# Patient Record
Sex: Male | Born: 1945 | Race: White | Hispanic: No | Marital: Married | Smoking: Never smoker
Health system: Southern US, Community
[De-identification: ages and names within clinical notes are randomized; demographics above are authoritative.]

## PROBLEM LIST (undated history)

## (undated) DIAGNOSIS — G4730 Sleep apnea, unspecified: Secondary | ICD-10-CM

## (undated) HISTORY — DX: Sleep apnea, unspecified: G47.30

## (undated) HISTORY — PX: ROTATOR CUFF REPAIR: SHX139

---

## 2011-07-08 ENCOUNTER — Ambulatory Visit (INDEPENDENT_AMBULATORY_CARE_PROVIDER_SITE_OTHER): Payer: PRIVATE HEALTH INSURANCE | Admitting: Internal Medicine

## 2011-07-08 ENCOUNTER — Encounter: Payer: Self-pay | Admitting: Internal Medicine

## 2011-07-08 VITALS — BP 124/82 | HR 97 | Ht 68.0 in | Wt 255.0 lb

## 2011-07-08 DIAGNOSIS — G4733 Obstructive sleep apnea (adult) (pediatric): Secondary | ICD-10-CM

## 2011-07-08 NOTE — Progress Notes (Signed)
Subjective:    Patient ID: Zachary Glass, male    DOB: 05/31/1946, 65 y.o.   MRN: [id number]  HPI 07/08/11- 40 yoM never smoker followed by Dr. Drucie Opitz in [employer] He comes for evaluation of sleep apnea. We are waiting for his diagnostic  NPSG report which was done at [facility] around 1993. He is now on his second CPAP machine and interested in upgrading. He has used an Industrial/product designer which has been associated with epistaxis. He has seen ENT physicians for cautery. He does not know a CPAP pressure, using Advanced. Medical history has included borderline high blood pressure, no heart or lung disease. He had UPPP surgery with turbinate reduction. Bedtime is now between 11 PM and 63M. He falls asleep quickly, waking once or twice before finally up at 7 AM. Weight has gone up 10 pounds. Review of Systems Constitutional:   No-   weight loss, night sweats, fevers, chills, fatigue, lassitude. HEENT:   No-  headaches, difficulty swallowing, tooth/dental problems, sore throat,       No-  sneezing, itching, ear ache, nasal congestion, post nasal drip,  CV:  No-   chest pain, orthopnea, PND, swelling in lower extremities, anasarca, dizziness, palpitations Resp: No-   shortness of breath with exertion or at rest.              No-   productive cough,  No non-productive cough,  No-  coughing up of blood.              No-   change in color of mucus.  No- wheezing.   Skin: No-   rash or lesions. GI:  No-   heartburn, indigestion, abdominal pain, nausea, vomiting, diarrhea,                 change in bowel habits, loss of appetite GU: No-   dysuria, change in color of urine, no urgency or frequency.  No- flank pain. MS:  No-   joint pain or swelling.  No- decreased range of motion.  No- back pain. Neuro- grossly normal to observation, Or:  Psych:  No- change in mood or affect. No depression or anxiety.  No memory loss.      Objective:   Physical Exam General- Alert, Oriented,  Affect-appropriate, Distress- none acute    Skin- rash-none, lesions- none, excoriation- none Lymphadenopathy- none Head- atraumatic            Eyes- Gross vision intact, PERRLA, conjunctivae clear secretions            Ears- Hearing, canals- normal            Nose- Clear, no -Septal dev, mucus, polyps, erosion, perforation;      no evidence of bleeding seen            Throat- +s/p UPPP , mucosa clear , drainage- none, tonsils- atrophic Neck- flexible , trachea midline, no stridor , thyroid nl, carotid no bruit Chest - symmetrical excursion , unlabored           Heart/CV- RRR , no murmur , no gallop  , no rub, nl s1 s2                           - JVD- none , edema- none, stasis changes- none, varices- none           Lung- clear to P&A, wheeze- none, cough- none , dullness-none,  rub- none           Chest wall-  Abd- tender-no, distended-no, bowel sounds-present, HSM- no Br/ Gen/ Rectal- Not done, not indicated Extrem- cyanosis- none, clubbing, none, atrophy- none, strength- nl Neuro- grossly intact to observation         Assessment & Plan:

## 2011-07-08 NOTE — Patient Instructions (Addendum)
We will seek your old Sleep Study documentation which is probably available through the GCDA paper chart  [facility] - please ask [facility] for the settings of his current CPAP machine for our record.  Order- [facility]- PCC- replacement CPAP machine equivalent to his present machine and settings. Mask of choice and supplies.Cool humidifier.  Dx OSA

## 2011-07-12 ENCOUNTER — Encounter: Payer: Self-pay | Admitting: Internal Medicine

## 2011-07-12 DIAGNOSIS — G4733 Obstructive sleep apnea (adult) (pediatric): Secondary | ICD-10-CM | POA: Insufficient documentation

## 2011-07-12 NOTE — Assessment & Plan Note (Signed)
He has a strong history for obstructive sleep apnea. We're going to need to find his original diagnostic study or anticipate updating his study. He has been fully compliant with CPAP and found it quite effective. His nasal mask may have contributed to epistaxis by directing a jet of air into his nostril. He may find a different mask style more satisfactory. We will go forward getting the replacement rig and supplies that he needs

## 2011-07-15 ENCOUNTER — Telehealth: Payer: Self-pay | Admitting: Internal Medicine

## 2011-07-15 DIAGNOSIS — G4733 Obstructive sleep apnea (adult) (pediatric): Secondary | ICD-10-CM

## 2011-07-15 NOTE — Telephone Encounter (Signed)
Called number provided above, [phone], but received message stating the number has been changed, disconnected, or is no longer in service.  Called pt's home number,[phone], [email or web address]

## 2011-07-16 NOTE — Telephone Encounter (Signed)
PT RETURNED CALL. ASKS THAT NURSE CALL HIM BACK "SHORTLY" AS HE HAS TO LEAVE.

## 2011-07-16 NOTE — Telephone Encounter (Signed)
I called and left message on patients cell number that we have placed the order for a new machine VPAP and he should be hearing from his DME company. If any questions or concerns to please call the office and speak with me.   Order placed.

## 2011-07-16 NOTE — Telephone Encounter (Signed)
In response to his message today-Original NPSG from 12/19/92 is now available documenting severe obstructive sleep apnea with RDI/ AHI 72/hr.  Order- [facility]- through his DME- (We had also ordered at recent office visit)  New machine autopap or VPap range 5-15 cwp. Heated humidifier

## 2011-07-16 NOTE — Telephone Encounter (Signed)
Correct cell number is [phone]. Pt says he has had his current cpap machine since approx 2001 and wants to know if a vpap would be an option for him. He complains of recurrent nose bleeds and has heard that vpap is at a variable pressure. He would like to get a new machine before Oct 1 because this is when he goes on Medicare. Pt wants to know if a new sleep study would be needed as well because he would like to do this without having to do a new study. Pls advise.

## 2011-07-21 ENCOUNTER — Encounter: Payer: Self-pay | Admitting: Internal Medicine

## 2011-07-24 ENCOUNTER — Telehealth: Payer: Self-pay | Admitting: Internal Medicine

## 2011-07-24 NOTE — Telephone Encounter (Signed)
[email or web address]  

## 2011-07-25 NOTE — Telephone Encounter (Signed)
Pt returning call can be reached at [phone].Zachary Glass

## 2011-07-25 NOTE — Telephone Encounter (Signed)
Called # provided below - [email or web address]

## 2011-07-25 NOTE — Telephone Encounter (Signed)
Spoke with the pt and he states he is having issues with [facility] and getting his VPAP. He states he has been on CPAP x 20 years and was rec to change to VPAP due to nose bleeds that was believed to be caused by the pressure of the CPAP, so that is why he is changing to VPAP. He states he spoke to his insurance company and they state all they need is an Advertising account executive for VPAP and they will review the request.  He also states they are requesting a letter of medical necessity stating why the pt needs the VPAP.  Pt is requesting to speak to Dr. Maple Hudson about this as well. I have called [facility] to see what the issue is and had to leave a message with the resp team to call us back. The pt is asking that all this be done before 09-01-11 because that is when he go on on medicare. I will await [facility] call and then go from there. Carron Curie, CMA

## 2011-07-28 NOTE — Telephone Encounter (Signed)
lmom that per rhonda new order for vpap has been sent as of 8/22 and [facility] is currently working with his insurance company to get this covered also left on message if he has not heard from [facility] within 2 weeks about this to please give our pcc's a call so they can f/u on it

## 2011-09-24 ENCOUNTER — Encounter: Payer: Self-pay | Admitting: Internal Medicine

## 2011-10-28 ENCOUNTER — Encounter: Payer: Self-pay | Admitting: Internal Medicine

## 2011-12-24 ENCOUNTER — Encounter: Payer: Self-pay | Admitting: Internal Medicine

## 2012-07-07 ENCOUNTER — Ambulatory Visit: Payer: PRIVATE HEALTH INSURANCE | Admitting: Internal Medicine

## 2013-02-01 ENCOUNTER — Ambulatory Visit: Payer: PRIVATE HEALTH INSURANCE | Admitting: Internal Medicine

## 2013-02-02 ENCOUNTER — Telehealth: Payer: Self-pay | Admitting: Internal Medicine

## 2013-02-02 NOTE — Telephone Encounter (Signed)
Per Zachary Glass okay to work pt in 02/15/13 at 11:00. I have scheduled him at that time. He needed nothing further

## 2013-02-15 ENCOUNTER — Ambulatory Visit (INDEPENDENT_AMBULATORY_CARE_PROVIDER_SITE_OTHER): Payer: Medicare Other | Admitting: Internal Medicine

## 2013-02-15 ENCOUNTER — Encounter: Payer: Self-pay | Admitting: Internal Medicine

## 2013-02-15 VITALS — BP 128/80 | HR 92 | Ht 67.75 in | Wt 249.6 lb

## 2013-02-15 DIAGNOSIS — G4733 Obstructive sleep apnea (adult) (pediatric): Secondary | ICD-10-CM

## 2013-02-15 NOTE — Progress Notes (Signed)
Subjective:    Patient ID: Zachary Glass, male    DOB: Dec 08, 1945, 67 y.o.   MRN: [id number]  HPI 07/08/11- 28 yoM never smoker followed by Dr. Drucie Opitz in [facility]. He comes for evaluation of sleep apnea. We are waiting for his diagnostic  NPSG report which was done at [facility] around 1993. He is now on his second CPAP machine and interested in upgrading. He has used an Industrial/product designer which has been associated with epistaxis. He has seen ENT physicians for cautery. He does not know a CPAP pressure, using Advanced. Medical history has included borderline high blood pressure, no heart or lung disease. He had UPPP surgery with turbinate reduction. Bedtime is now between 11 PM and 27M. He falls asleep quickly, waking once or twice before finally up at 7 AM. Weight has gone up 10 pounds.  02/15/13-  11 yoM never smoker followed by Dr. Drucie Opitz in [facility]. Followed here for  sleep apnea FOLLOWS FOR: still wears PAP every night; has had several ups and downs with machine and service. Now using VPAP/ [facility] I 15- E 5, PS 4. Swift nasal mask w/ chin strap. Sleeping well  Review of Systems Constitutional:   No-   weight loss, night sweats, fevers, chills, fatigue, lassitude. HEENT:   No-  headaches, difficulty swallowing, tooth/dental problems, sore throat,       No-  sneezing, itching, ear ache, nasal congestion, post nasal drip,  CV:  No-   chest pain, orthopnea, PND, swelling in lower extremities, anasarca, dizziness, palpitations Resp: No-   shortness of breath with exertion or at rest.              No-   productive cough,  No non-productive cough,  No-  coughing up of blood.              No-   change in color of mucus.  No- wheezing.   Skin: No-   rash or lesions. GI:  No-   heartburn, indigestion, abdominal pain, nausea, vomiting, GU: . MS:  No-   joint pain or swelling.   Neuro- nothing unusual  Psych:  No- change in mood or affect. No depression or anxiety.  No  memory loss.  Objective:   Physical Exam General- Alert, Oriented, Affect-appropriate, Distress- none acute    Skin- rash-none, lesions- none, excoriation- none Lymphadenopathy- none Head- atraumatic            Eyes- Gross vision intact, PERRLA, conjunctivae clear secretions            Ears- Hearing, canals- normal            Nose- Clear, no -Septal dev, mucus, polyps, erosion, perforation;                  Throat- +s/p UPPP , mucosa clear , drainage- none, tonsils- atrophic Neck- flexible , trachea midline, no stridor , thyroid nl, carotid no bruit Chest - symmetrical excursion , unlabored           Heart/CV- RRR , no murmur , no gallop  , no rub, nl s1 s2                           - JVD- none , edema- none, stasis changes- none, varices- none           Lung- clear to P&A, wheeze- none, cough- none , dullness-none, rub- none  Chest wall-  Abd- Br/ Gen/ Rectal- Not done, not indicated Extrem- cyanosis- none, clubbing, none, atrophy- none, strength- nl Neuro- grossly intact to observation   Assessment & Plan:

## 2013-02-15 NOTE — Patient Instructions (Addendum)
We can continue VPAP 15-5, PS 4/ [facility]    Please call as needed

## 2013-02-19 NOTE — Assessment & Plan Note (Signed)
Good compliance and control. Weight loss would help as discussed.

## 2014-02-15 ENCOUNTER — Ambulatory Visit (INDEPENDENT_AMBULATORY_CARE_PROVIDER_SITE_OTHER): Payer: Medicare Other | Admitting: Internal Medicine

## 2014-02-15 ENCOUNTER — Encounter (INDEPENDENT_AMBULATORY_CARE_PROVIDER_SITE_OTHER): Payer: Self-pay

## 2014-02-15 ENCOUNTER — Encounter: Payer: Self-pay | Admitting: Internal Medicine

## 2014-02-15 VITALS — BP 144/82 | HR 82 | Ht 67.75 in | Wt 236.0 lb

## 2014-02-15 DIAGNOSIS — G4733 Obstructive sleep apnea (adult) (pediatric): Secondary | ICD-10-CM

## 2014-02-15 NOTE — Progress Notes (Signed)
Subjective:    Patient ID: Zachary Glass, male    DOB: June 03, 1946, 68 y.o.   MRN: [id number]  HPI 07/08/11- 42 yoM never smoker followed by Dr. Drucie Opitz in [facility] He comes for evaluation of sleep apnea. We are waiting for his diagnostic  NPSG report which was done at [facility] around 1993. He is now on his second CPAP machine and interested in upgrading. He has used an Industrial/product designer which has been associated with epistaxis. He has seen ENT physicians for cautery. He does not know a CPAP pressure, using [facility]. Medical history has included borderline high blood pressure, no heart or lung disease. He had UPPP surgery with turbinate reduction. Bedtime is now between 11 PM and 72M. He falls asleep quickly, waking once or twice before finally up at 7 AM. Weight has gone up 10 pounds.  02/15/13-  70 yoM never smoker followed by Dr. Drucie Opitz in [facility]. Followed here for  sleep apnea FOLLOWS FOR: still wears PAP every night; has had several ups and downs with machine and service. Now using VPAP/ [facility] I 15- E 5, PS 4. Swift nasal mask w/ chin strap. Sleeping well  02/15/14- 67 yoM never smoker followed by Dr. Drucie Opitz in [facility]. Followed here for  sleep apnea Follows for: wears VPAP/ [facility] I 15- E 5, PS 4. Swift nasal mask w/ chin strap nightly, 6-8 hours.  Has an appt 3/25 to change headgear.  Had sinus sx with Dr. Christell Constant in [facility]  10/2013-resolved. Strap itches back of scalp- trying for a different fit.   Review of Systems Constitutional:   No-   weight loss, night sweats, fevers, chills, fatigue, lassitude. HEENT:   No-  headaches, difficulty swallowing, tooth/dental problems, sore throat,       No-  sneezing, itching, ear ache, nasal congestion, post nasal drip,  CV:  No-   chest pain, orthopnea, PND, swelling in lower extremities, anasarca, dizziness, palpitations Resp: No-   shortness of breath with exertion or at rest.              No-    productive cough,  No non-productive cough,  No-  coughing up of blood.              No-   change in color of mucus.  No- wheezing.   Skin: +itching occipital area ? PAP strap? GI:  No-   heartburn, indigestion, abdominal pain, nausea, vomiting, GU: . MS:  No-   joint pain or swelling.   Neuro- nothing unusual  Psych:  No- change in mood or affect. No depression or anxiety.  No memory loss.  Objective:   Physical Exam General- Alert, Oriented, Affect-appropriate, Distress- none acute , overweight Skin- rash-none, lesions- none, excoriation- none Lymphadenopathy- none Head- atraumatic            Eyes- Gross vision intact, PERRLA, conjunctivae clear secretions            Ears- Hearing, canals- normal            Nose- Clear, no -Septal dev, mucus+, polyps, erosion, perforation;                  Throat- +s/p UPPP , mucosa clear , drainage- none, tonsils- atrophic Neck- flexible , trachea midline, no stridor , thyroid nl, carotid no bruit Chest - symmetrical excursion , unlabored           Heart/CV- RRR , no  murmur , no gallop  , no rub, nl s1 s2                           - JVD- none , edema- none, stasis changes- none, varices- none           Lung- clear to P&A, wheeze- none, cough- none , dullness-none, rub- none           Chest wall-  Abd- Br/ Gen/ Rectal- Not done, not indicated Extrem- cyanosis- none, clubbing, none, atrophy- none, strength- nl Neuro- grossly intact to observation   Assessment & Plan:

## 2014-02-15 NOTE — Assessment & Plan Note (Signed)
Discussed mask fit options. He will address w/ DME [facility]. Ok to continue VPAP 15/5 PS 4- good compliance and control

## 2014-02-15 NOTE — Patient Instructions (Signed)
We can continue VPAP 15/ 5 PS14    [facility]  Please call as needed

## 2015-02-13 ENCOUNTER — Encounter: Payer: Self-pay | Admitting: Internal Medicine

## 2015-02-13 ENCOUNTER — Encounter (INDEPENDENT_AMBULATORY_CARE_PROVIDER_SITE_OTHER): Payer: Self-pay

## 2015-02-13 ENCOUNTER — Ambulatory Visit (INDEPENDENT_AMBULATORY_CARE_PROVIDER_SITE_OTHER): Payer: Medicare Other | Admitting: Internal Medicine

## 2015-02-13 VITALS — BP 136/78 | HR 97 | Ht 67.75 in | Wt 228.4 lb

## 2015-02-13 DIAGNOSIS — G4733 Obstructive sleep apnea (adult) (pediatric): Secondary | ICD-10-CM | POA: Diagnosis not present

## 2015-02-13 NOTE — Progress Notes (Signed)
Subjective:    Patient ID: Zachary Glass, male    DOB: 08-12-1946, 69 y.o.   MRN: [id number]  HPI 07/08/11- 13 yoM never smoker followed by Dr. Drucie Opitz in [facility]. He comes for evaluation of sleep apnea. We are waiting for his diagnostic  NPSG report which was done at [facility] around 1993. He is now on his second CPAP machine and interested in upgrading. He has used an Industrial/product designer which has been associated with epistaxis. He has seen ENT physicians for cautery. He does not know a CPAP pressure, using Advanced. Medical history has included borderline high blood pressure, no heart or lung disease. He had UPPP surgery with turbinate reduction. Bedtime is now between 11 PM and 25M. He falls asleep quickly, waking once or twice before finally up at 7 AM. Weight has gone up 10 pounds.  02/15/13-  60 yoM never smoker followed by Dr. Drucie Opitz in [facility]. Followed here for  sleep apnea FOLLOWS FOR: still wears PAP every night; has had several ups and downs with machine and service. Now using VPAP/ [facility] I 15- E 5, PS 4. Swift nasal mask w/ chin strap. Sleeping well  02/15/14- 67 yoM never smoker followed by Dr. Drucie Opitz in [facility]. Followed here for  sleep apnea Follows for: wears VPAP/ [facility] I 15- E 5, PS 4. Swift nasal mask w/ chin strap nightly, 6-8 hours.  Has an appt 3/25 to change headgear.  Had sinus sx with Dr. Christell Constant in [facility]  10/2013-resolved. Strap itches back of scalp- trying for a different fit.   02/13/15- 67 yoM never smoker followed for OSA,  PCP Dr. Drucie Opitz in [facility].  FOLLOWS FOR: Wears ViPAP 15 Insp/ 5 Exp, PS 4, [facility] every night for about 6-7 hours;  He feels he benefits and is doing well. We discussed comfort measures.  Review of Systems Constitutional:   No-   weight loss, night sweats, fevers, chills, fatigue, lassitude. HEENT:   No-  headaches, difficulty swallowing, tooth/dental problems, sore throat,       No-   sneezing, itching, ear ache, nasal congestion, post nasal drip,  CV:  No-   chest pain, orthopnea, PND, swelling in lower extremities, anasarca, dizziness, palpitations Resp: No-   shortness of breath with exertion or at rest.              No-   productive cough,  No non-productive cough,  No-  coughing up of blood.              No-   change in color of mucus.  No- wheezing.   Skin:  GI:  No-   heartburn, indigestion, abdominal pain, nausea, vomiting, GU: . MS:  No-   joint pain or swelling.   Neuro- nothing unusual  Psych:  No- change in mood or affect. No depression or anxiety.  No memory loss.  Objective:   Physical Exam General- Alert, Oriented, Affect-appropriate, Distress- none acute , overweight Skin- rash-none, lesions- none, excoriation- none Lymphadenopathy- none Head- atraumatic            Eyes- Gross vision intact, PERRLA, conjunctivae clear secretions            Ears- Hearing, canals- normal            Nose- Clear, no -Septal dev, mucus+, polyps, erosion, perforation;                  Throat- +  s/p UPPP , +mucosa a little dry , drainage- none, tonsils- atrophic Neck- flexible , trachea midline, no stridor , thyroid nl, carotid no bruit Chest - symmetrical excursion , unlabored           Heart/CV- RRR , no murmur , no gallop  , no rub, nl s1 s2                           - JVD- none , edema- none, stasis changes- none, varices- none           Lung- clear to P&A, wheeze- none, cough- none , dullness-none, rub- none           Chest wall-  Abd- Br/ Gen/ Rectal- Not done, not indicated Extrem- cyanosis- none, clubbing, none, atrophy- none, strength- nl Neuro- grossly intact to observation   Assessment & Plan:

## 2015-02-13 NOTE — Patient Instructions (Signed)
We can continue BIPAP 15/5 [facility]  Consider trying otc mouth rinse Biotene at bedtime a few nights, to deal with dry mouth  Order- DME [facility]- download for pressure compliance, and Airview if available   Dx OSA

## 2015-02-25 NOTE — Assessment & Plan Note (Signed)
He seems to be doing well with good apparent compliance and control. Medically necessary. Plan-download for documentation

## 2016-02-12 ENCOUNTER — Encounter: Payer: Self-pay | Admitting: Internal Medicine

## 2016-02-12 ENCOUNTER — Ambulatory Visit (INDEPENDENT_AMBULATORY_CARE_PROVIDER_SITE_OTHER): Payer: Medicare Other | Admitting: Internal Medicine

## 2016-02-12 VITALS — BP 144/90 | HR 98 | Ht 67.75 in | Wt 228.6 lb

## 2016-02-12 DIAGNOSIS — G4733 Obstructive sleep apnea (adult) (pediatric): Secondary | ICD-10-CM | POA: Diagnosis not present

## 2016-02-12 DIAGNOSIS — R21 Rash and other nonspecific skin eruption: Secondary | ICD-10-CM | POA: Diagnosis not present

## 2016-02-12 NOTE — Assessment & Plan Note (Signed)
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 I recommended follow-up with dermatology, or ask his PCP to [facility].

## 2016-02-12 NOTE — Assessment & Plan Note (Signed)
He may be appropriate for oral appliance consideration as an alternative to CPAP. Plan-update sleep study

## 2016-02-12 NOTE — Progress Notes (Signed)
Subjective:    Patient ID: Zachary Glass, male    DOB: 1946-08-21, 70 y.o.   MRN: [id number]  HPI 07/08/11- 73 yoM never smoker followed by Dr. Drucie Opitz in [facility] He comes for evaluation of sleep apnea. We are waiting for his diagnostic  NPSG report which was done at [facility] around 1993. He is now on his second CPAP machine and interested in upgrading. He has used an Industrial/product designer which has been associated with epistaxis. He has seen ENT physicians for cautery. He does not know a CPAP pressure, using [facility]. Medical history has included borderline high blood pressure, no heart or lung disease. He had UPPP surgery with turbinate reduction. Bedtime is now between 11 PM and 66M. He falls asleep quickly, waking once or twice before finally up at 7 AM. Weight has gone up 10 pounds.  02/15/13-  70 yoM never smoker followed by Dr. Drucie Opitz in [facility]. Followed here for  sleep apnea FOLLOWS FOR: still wears PAP every night; has had several ups and downs with machine and service. Now using VPAP/ [facility] I 15- E 5, PS 4. Swift nasal mask w/ chin strap. Sleeping well  02/15/14- 67 yoM never smoker followed by Dr. Drucie Opitz in [facility]. Followed here for  sleep apnea Follows for: wears VPAP/ [facility] I 15- E 5, PS 4. Swift nasal mask w/ chin strap nightly, 6-8 hours.  Has an appt 3/25 to change headgear.  Had sinus sx with Dr. Christell Constant in [facility]  10/2013-resolved. Strap itches back of scalp- trying for a different fit.   02/13/15- 67 yoM never smoker followed for OSA/ UPPP,  PCP Dr. Drucie Opitz in [facility].  FOLLOWS FOR: Wears ViPAP 15 Insp/ 5 Exp, PS 4, [facility] every night for about 6-7 hours;  He feels he benefits and is doing well. We discussed comfort measures.  02/12/2016-70 year old male never smoker followed for OSA VPAP I 15- E 5, PS 4/ [facility] FOLLOWS FOR: Pt wears VPAP nightly-DME: [facility]. No recent DL. Pt states he is having itchy spells since  [facility] like suggestions of what caused it and what to do to help. His original sleep study was done in 1993. He continues to be irritated by mask and straps which he finds annoying. We discussed alternatives. He will need updated study to get insurance coverage for additional intervention. Additional problem-itching. Onset after her arms got irritated using a leaf blower during [place]. Has had raised crusting lesions on his hands and arms. Biopsy by dermatology "staph". He's been treated with topical medication from dermatology. He is instructed to a thin Clorox water. Still having occasional new lesions which he'll with scarring.  Review of Systems Constitutional:   No-   weight loss, night sweats, fevers, chills, fatigue, lassitude. HEENT:   No-  headaches, difficulty swallowing, tooth/dental problems, sore throat,       No-  sneezing, itching, ear ache, nasal congestion, post nasal drip,  CV:  No-   chest pain, orthopnea, PND, swelling in lower extremities, anasarca, dizziness, palpitations Resp: No-   shortness of breath with exertion or at rest.              No-   productive cough,  No non-productive cough,  No-  coughing up of blood.              No-   change in color of mucus.  No- wheezing.   Skin:  GI:  No-   heartburn, indigestion, abdominal pain, nausea, vomiting, GU: . MS:  No-   joint pain or swelling.   Neuro- nothing unusual  Psych:  No- change in mood or affect. No depression or anxiety.  No memory loss.  Objective:   Physical Exam General- Alert, Oriented, Affect-appropriate, Distress- none acute , overweight Skin- + small crusted lesion on the thumb web. Area of scarring on left hand. Dry skin. Lymphadenopathy- none Head- atraumatic            Eyes- Gross vision intact, PERRLA, conjunctivae clear secretions            Ears- Hearing, canals- normal            Nose- Clear, no -Septal dev, mucus+, polyps, erosion, perforation;                   Throat- +s/p UPPP , +mucosa a little dry , drainage- none, tonsils- atrophic Neck- flexible , trachea midline, no stridor , thyroid nl, carotid no bruit Chest - symmetrical excursion , unlabored           Heart/CV- RRR , no murmur , no gallop  , no rub, nl s1 s2                           - JVD- none , edema- none, stasis changes- none, varices- none           Lung- clear to P&A, wheeze- none, cough- none , dullness-none, rub- none           Chest wall-  Abd- Br/ Gen/ Rectal- Not done, not indicated Extrem- cyanosis- none, clubbing, none, atrophy- none, strength- nl Neuro- grossly intact to observation   Assessment & Plan:

## 2016-02-12 NOTE — Patient Instructions (Signed)
Order- schedule unattended home sleep test   Dx OSA  Suggest you get another dermatology opinion about the itching rash  You could ask [facility] when, from their records, you might be eligible for a new CPAP machine, and whether they have information on the Transcend portable machines  We have talked about looking into an oral appliance instead of CPAP

## 2016-03-11 DIAGNOSIS — G4733 Obstructive sleep apnea (adult) (pediatric): Secondary | ICD-10-CM | POA: Diagnosis not present

## 2016-03-27 ENCOUNTER — Ambulatory Visit: Payer: Medicare Other | Admitting: Internal Medicine

## 2016-03-31 ENCOUNTER — Encounter: Payer: Self-pay | Admitting: Internal Medicine

## 2016-03-31 ENCOUNTER — Ambulatory Visit (INDEPENDENT_AMBULATORY_CARE_PROVIDER_SITE_OTHER): Payer: Medicare Other | Admitting: Internal Medicine

## 2016-03-31 VITALS — BP 122/70 | HR 81 | Ht 67.75 in | Wt 229.4 lb

## 2016-03-31 DIAGNOSIS — G4733 Obstructive sleep apnea (adult) (pediatric): Secondary | ICD-10-CM

## 2016-03-31 NOTE — Patient Instructions (Signed)
Order- DME [facility]    Small/ travel PAP machine- if available want VPAP insp 15, exp 5, PS 4  With supplies, mask of choice, humidifier   Dx OSA. Please discuss alternatives with patient  Can also look on-line at sites like CPAP.com to look at alternatives available  Suggest you talk with your primary physician about the easy bruising on your fore-arms. It may just be related to age and sun-damage, but ask him if liver function and blood clotting tests have been done recently.

## 2016-03-31 NOTE — Progress Notes (Signed)
Subjective:    Patient ID: Zachary Glass, male    DOB: Aug 28, 1946, 70 y.o.   MRN: [id number]  HPI 07/08/11- [age] yoM never smoker followed [place] Drucie OpitzGordon [place] [facility] [place]. He comes for evaluation of sleep apnea. We are waiting for his diagnostic  NPSG report which was done [facility] around [facility]. He is now on his second CPAP machine and interested in upgrading. 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 He has seen ENT physicians for cautery. He does not know a CPAP pressure, using [facility]. Medical history has included borderline high blood pressure, no heart or lung disease. He had UPPP surgery with turbinate reduction. Bedtime is now between 11 PM and 30M. He falls asleep quickly, waking once or twice before finally up at 7 AM. Weight has gone up 10 pounds.  02/15/13-  [age] yoM never smoker followed [place] Drucie [place]itzGordon [facility] [place] [facility] [place]. Followed here for  sleep apnea FOLLOWS FOR: still wears PAP every night; has had several ups and downs with machine and service. Now using VPAP/ [facility] I 15- E 5, PS 4. Swift nasal mask w/ chin strap. Sleeping well  02/15/14- 67 yoM never smoker followed [place] Drucie [place]itzGordon [place] [facility] [place]. Followed here for  sleep apnea Follows for: wears VPAP/ [facility] I 15- E 5, PS 4. Swift nasal mask w/ chin strap nightly, 6-8 hours.  Has an appt 3/25 to change headgear.  [place] [facility] [place]  10/[place]-resolved. Strap itches back of scalp- trying for a different fit.   02/13/15- 67 yoM never smoker followed for OSA/ UPPP,  [place] Dru[place] Op[place]Gordon [place] [facility] [place].  FOLLOWS FOR: Wears ViPAP 15 Insp/ 5 Exp, PS 4, [facility] every night for about 6-7 hours;  He feels he benefits and is doing well. We discussed comfort measures.  02/12/2016-70 year old male never smoker followed for OSA VPAP I 15- E 5, PS 4/ [facility] FOLLOWS FOR: Pt wears VPAP nightly-DME: [facility]. No recent DL. Pt states he is having [facility] spells [facility]-would like suggestions of what caused it and what to do to help. His original sleep study was done in 1993. He continues to be irritated by mask and straps which he finds annoying. We discussed alternatives. He will need updated study to get insurance coverage for additional intervention. Additional problem-itching. Onset  arms got irritated using a leaf blower [place]. Has had raised crusting lesions on his hands and arms. Biopsy by dermatology "staph". He's been treated with topical medication from dermatology. He is instructed to bathe in Clorox water. Still having occasional new lesions which heal with scarring.  03/31/2016-70 year old male never smoker followed for OSA, complicated by staph dermatitis VPAP I 15- E 5, PS 4/ [facility] FOLLOWS FOR: Review HST with patient; might be due for new BiPAP in 08-2016 and/or travel unit.     Review of Systems Constitutional:   No-   weight loss, night sweats, fevers, chills, fatigue, lassitude. HEENT:   No-  headaches, difficulty swallowing, tooth/dental problems, sore throat,       No-  sneezing, itching, ear ache, nasal congestion, post nasal drip,  CV:  No-   chest pain, orthopnea, PND, swelling in lower extremities, anasarca, dizziness, palpitations Resp: No-   shortness of breath with exertion or at rest.              No-   productive  cough,  No non-productive cough,  No-  coughing up of blood.              No-   change in color of mucus.  No- wheezing.   Skin:  GI:  No-   heartburn, indigestion, abdominal pain, nausea, vomiting, GU: . MS:  No-   joint pain or swelling.   Neuro- nothing unusual  Psych:  No- change in mood or affect. No depression or anxiety.  No memory loss.  Objective:   Physical Exam General- Alert, Oriented, Affect-appropriate, Distress- none acute , overweight Skin- + small crusted lesion on the thumb web. Area of scarring on left hand. Dry skin. Lymphadenopathy- none Head-  atraumatic            Eyes- Gross vision intact, PERRLA, conjunctivae clear secretions            Ears- Hearing, canals- normal            Nose- Clear, no -Septal dev, mucus+, polyps, erosion, perforation;                  Throat- +s/p UPPP , +mucosa a little dry , drainage- none, tonsils- atrophic Neck- flexible , trachea midline, no stridor , thyroid nl, carotid no bruit Chest - symmetrical excursion , unlabored           Heart/CV- RRR , no murmur , no gallop  , no rub, nl s1 s2                           - JVD- none , edema- none, stasis changes- none, varices- none           Lung- clear to P&A, wheeze- none, cough- none , dullness-none, rub- none           Chest wall-  Abd- Br/ Gen/ Rectal- Not done, not indicated Extrem- cyanosis- none, clubbing, none, atrophy- none, strength- nl Neuro- grossly intact to observation   Assessment & Plan:

## 2016-04-02 DIAGNOSIS — G4733 Obstructive sleep apnea (adult) (pediatric): Secondary | ICD-10-CM | POA: Diagnosis not present

## 2016-04-03 ENCOUNTER — Other Ambulatory Visit: Payer: Self-pay | Admitting: *Deleted

## 2016-04-03 DIAGNOSIS — G4733 Obstructive sleep apnea (adult) (pediatric): Secondary | ICD-10-CM

## 2016-08-12 ENCOUNTER — Telehealth: Payer: Self-pay | Admitting: Internal Medicine

## 2016-08-12 DIAGNOSIS — G4733 Obstructive sleep apnea (adult) (pediatric): Secondary | ICD-10-CM

## 2016-08-12 NOTE — Telephone Encounter (Signed)
If patient wants to replace old machine- ok to order replacement for old CPAP, current settings, mask of choice, supplies, humidifier, AirView   Dx OSA

## 2016-08-12 NOTE — Telephone Encounter (Signed)
Dr Maple HudsonYoung, [facility] is now eligible for new CPAP- do you want us to send in a an order? Please advise, thanks!

## 2016-08-12 NOTE — Telephone Encounter (Signed)
Attempted to contact patient, left message for patient to return call.

## 2016-08-13 NOTE — Telephone Encounter (Signed)
Attempted to contact patient, left message for patient to return call.

## 2016-08-14 NOTE — Telephone Encounter (Signed)
Order was placed for new machine & I called pt to discuss which new dme he would like to use.  Pt states he doesn't know if he wants to switch companies or not because he wants to check with the different ones to see what they offer.  He wants Dr Maple HudsonYoung to let him know which brands he prefers - he asked that he be given his top 3 recommendations & then he will call the dme companies to see what they have.  He states he may not research this until a month from now but would like to go ahead & get Dr Roxy CedarYoung's recommendations.  I told him I would close out the order that was created today & when he decides which company he wants to use to call us back & let the nurse know so a new order can be put in.

## 2016-08-14 NOTE — Telephone Encounter (Signed)
Well most of the dme's take him medicare and his sup- just give an order to change his dme and put his cpap settings and supplies in the order [employer]ting[employer]

## 2016-08-14 NOTE — Telephone Encounter (Signed)
Order placed. Thanks.

## 2016-08-14 NOTE — Telephone Encounter (Signed)
Spoke with pt. States that he would like a new machine but is thinking about going with a different DME. [facility] that [facility] when it comes to his supplies. Pt would like to know what other DME's are in network with his insurance.  [facility] can you help with this? Thank you.

## 2016-11-25 ENCOUNTER — Telehealth: Payer: Self-pay | Admitting: Internal Medicine

## 2016-11-25 NOTE — Telephone Encounter (Signed)
Pt returning call.Zachary Glass ° °

## 2016-11-25 NOTE — Telephone Encounter (Signed)
lmtcb X1 for Melissa at [facility]. 

## 2016-11-25 NOTE — Telephone Encounter (Signed)
[facility]states the patient can not get his BiPAP/VPAP as they do not have a CPAP titration failed testing on file and Insurance requires this for payment. I explained this to the patient and he has further questions as to why they need it now since he has been on Medicare since 2006. Pt is aware that we will speak with our rep [facility] and get back with him.  Triage please contact [facility] rep-Melissa or person covering her to speak with patient regarding this matter.

## 2016-11-25 NOTE — Telephone Encounter (Signed)
Called and lmom to make the pt aware that we are waiting to hear back from [facility] and that once we hear from her, we will call him with an update.

## 2016-11-26 NOTE — Telephone Encounter (Signed)
lmtcb for Zachary Glass with [facility]

## 2016-11-26 NOTE — Telephone Encounter (Signed)
Melissa returned phone call: [phone].Charm Rings.Erica R Taylor

## 2016-11-26 NOTE — Telephone Encounter (Signed)
[facility] with [facility] with [facility], who states pt was placed on a cpap years back and started having a nose bleeds, so he wanted to be placed on a bipap. Melissa states basically pt was placed on bipap without the proper testing, therefore [facility] was not paid by pt's insurance. Now that pt is requesting a new bipap machine. Pt will need to go for a sleep study and fail a cpap, and be titrated to a bipap. Melissa states pt is aware of this.   CY please advise. Thanks.

## 2016-12-05 NOTE — Telephone Encounter (Signed)
Ok to order split protocol NPSG for dx OSA. Please specify in order to titrate BIPAP if CPAP not tolerated.

## 2016-12-05 NOTE — Telephone Encounter (Signed)
Spoke with pt and made him aware of CY recommendations. Pt states he would like to discuss this issue with CY before doing any further testing. Pt is very upset and states he will not do this sleep study, until everyone's files match. CY doesn't have any open 30min slots until 02-09-17 CY please advise where pt can be worked in. Thanks.

## 2016-12-05 NOTE — Telephone Encounter (Signed)
lmtcb x1 for pt. 

## 2017-02-03 NOTE — Telephone Encounter (Signed)
Spoke with patient-he will be here Monday 02/09/17 at 11:30am. Nothing more needed at this time.

## 2017-02-09 ENCOUNTER — Encounter: Payer: Self-pay | Admitting: Internal Medicine

## 2017-02-09 ENCOUNTER — Ambulatory Visit (INDEPENDENT_AMBULATORY_CARE_PROVIDER_SITE_OTHER): Payer: Medicare Other | Admitting: Internal Medicine

## 2017-02-09 ENCOUNTER — Telehealth: Payer: Self-pay | Admitting: *Deleted

## 2017-02-09 VITALS — BP 148/86 | HR 101 | Ht 67.75 in | Wt 232.0 lb

## 2017-02-09 DIAGNOSIS — G4733 Obstructive sleep apnea (adult) (pediatric): Secondary | ICD-10-CM

## 2017-02-09 NOTE — Patient Instructions (Signed)
Order- DME [facility]- intolerant of CPAP. Needs replacement for old VPAP machine, Insp 15, Exp 5, PS 4, Mask of choice, supplies, AirView   Dx OSA  Please call as needed

## 2017-02-09 NOTE — Telephone Encounter (Signed)
-----   Message from Henderson NewcomerMelissa Stenson sent at 02/09/2017 12:22 PM EDT ----- Regarding: RE: BiPAP concers Hey.  He has been with us for a while.   I looked at his records and it looks like he did receive a bipap back in 2012 but we never received documentation at that time that he failed cpap and was moved to bipap.  He is now wanting a replacement bipap but we still don't have any documentation showing that he had a titration failing CPAP.  His insurance will not cover a new bipap with out that documentation.     Does this help? Melissa  ----- Message ----- From: Ronny BaconKatie C Welchel, CMA Sent: 02/09/2017  11:43 AM To: Melissa Stenson Subject: BiPAP concers                                  Melissa,  Can you please help me out-this patient was seen today and [facility] me [facility] being a patient with you for BiPAP services.   He states he has been with you guys for years! HELP PLEASE!! :)   Vivianne SpenceKatie Welchel,CMA

## 2017-02-09 NOTE — Progress Notes (Signed)
Subjective:    Patient ID: Zachary Glass, male    DOB: 1946/02/16, 71 y.o.   MRN: [id number]  HPI male never smoker followed for OSA/ UPPP, complicated by history of staph dermatitis NPSG 12/19/92- AHI 72/ hr, desaturation to 92%, body weight 250 lbs  ------------------------------------------------------------------------------------------------ 03/31/2016-71 year old male never smoker followed for OSA, complicated by staph dermatitis VPAP I 15- E 5, PS 4/ [facility] FOLLOWS FOR: Review HST with patient; might be due for new BiPAP in 08-2016 and/or travel unit. Unattended Home Sleep Test 03/11/2016-AHI 9.9/hour, desaturation to 86%, body weight 228 pounds  02/09/2017-71 year old male never smoker followed for OSA/ UPPP, complicated by history of staph dermatitis VPAP I 15, E 5, PS 4/ [facility] DME told him he would have to have a CPAP titration study demonstrating that BiPAP was required for a new BiPAP machine could be provided. FOLLOWS FOR: DME:[facility]. Pt [facility] not a [facility] theirs ([facility]). Pt then states that BiPAP not being covered by insurance due to no records from [facility]. Pt has CPAP titration in the past -prior to Medicare.  CPAP titration in 1994 had indicated 12 CWP He was changed from CPAP to VPAP in 2012 apparently on our order to his DME company. At that time a qualifying study apparently was not done-we have no record. Now he needs replacement machine and Medicare rules are requiring he have a study documenting that CPAP is insufficient. I have tried sending a renewal order to [facility], but they're saying this does not provide needed documentation. He has been very compliant with his VPAP and definitely feels that he sleeps better with it.  Review of Systems Constitutional:   No-   weight loss, night sweats, fevers, chills, fatigue, lassitude. HEENT:   No-  headaches, difficulty swallowing, tooth/dental problems, sore throat,     No-  sneezing, itching, ear ache, nasal congestion, post nasal drip,  CV:  No-   chest pain, orthopnea, PND, swelling in lower extremities, anasarca, dizziness, palpitations Resp: No-   shortness of breath with exertion or at rest.              No-   productive cough,  No non-productive cough,  No-  coughing up of blood.              No-   change in color of mucus.  No- wheezing.   Skin:  GI:  No-   heartburn, indigestion, abdominal pain, nausea, vomiting, GU: . MS:  No-   joint pain or swelling.   Neuro- nothing unusual  Psych:  No- change in mood or affect. No depression or anxiety.  No memory loss.  Objective:   Physical Exam General- Alert, Oriented, Affect-appropriate, Distress- none acute , + overweight Skin- no rash Lymphadenopathy- none Head- atraumatic            Eyes- Gross vision intact, PERRLA, conjunctivae clear secretions            Ears- Hearing, canals- normal            Nose- Clear, no -Septal dev, mucus+, polyps, erosion, perforation;                  Throat- +s/p UPPP , mucosa -clear, drainage- none, tonsils- atrophic Neck- flexible , trachea midline, no stridor , thyroid nl, carotid no bruit Chest - symmetrical excursion , unlabored           Heart/CV-  RRR , no murmur , no gallop  , no rub, nl s1 s2                           - JVD- none , edema- none, stasis changes- none, varices- none           Lung- clear to P&A, wheeze- none, cough- none , dullness-none, rub- none           Chest wall-  Abd- Br/ Gen/ Rectal- Not done, not indicated Extrem- cyanosis- none, clubbing, none, atrophy- none, strength- nl Neuro- grossly intact to observation   Assessment & Plan:

## 2017-02-09 NOTE — Assessment & Plan Note (Signed)
He had done very well with VPAP and remembers this as much more satisfactory than CPAP. His DME company is very clear now that he is going to require a documentation study showing that CPAP provides insufficient control before Medicare will cover VPAP machine. I will discuss alternatives, including a trial of CPAP with auto Pap, a CPAP/BiPAP titration study, an oral appliance.

## 2017-02-11 NOTE — Telephone Encounter (Signed)
Pt is aware of CPAP titration needed to show he can not tolerate CPAP and needs BiPAP. Pt is willing to go forward with CPAP study. Order placed and PCC's to contact patient to set up date and time. Nothing more needed at this time.

## 2017-02-11 NOTE — Telephone Encounter (Signed)
Please tell Mr Zachary Glass: We have verified that the problem is not how the order is written. His insurance will not cover his device on our notes alone, no matter what we say. His DME did accept the order that way years ago, but the rules have tightened. To get this through his insurance, we need to order a CPAP titration sleep study. When he is there, he can tell the technician if CPAP is not comfortable for him so the tech can convert to BIPAP during the study. That would be the best way.

## 2017-02-12 ENCOUNTER — Telehealth: Payer: Self-pay | Admitting: Internal Medicine

## 2017-02-12 NOTE — Telephone Encounter (Signed)
Pt is scheduled 03/16/17@8pm  for cpap titration study Zachary Glass

## 2017-02-13 NOTE — Telephone Encounter (Signed)
Spoke to pt he can not go  To sleep center 03/16/17 appt was moved to 03/31/17@8Pm  mailed sleep packet to him with appt info in it [employer]ally E Ottinger

## 2017-03-16 ENCOUNTER — Encounter (HOSPITAL_BASED_OUTPATIENT_CLINIC_OR_DEPARTMENT_OTHER): Payer: Medicare Other

## 2017-03-31 ENCOUNTER — Encounter (HOSPITAL_BASED_OUTPATIENT_CLINIC_OR_DEPARTMENT_OTHER): Payer: Medicare Other

## 2017-04-08 ENCOUNTER — Ambulatory Visit (HOSPITAL_BASED_OUTPATIENT_CLINIC_OR_DEPARTMENT_OTHER): Payer: Medicare Other | Attending: Internal Medicine | Admitting: Internal Medicine

## 2017-04-08 VITALS — Ht 70.0 in | Wt 225.0 lb

## 2017-04-08 DIAGNOSIS — G4733 Obstructive sleep apnea (adult) (pediatric): Secondary | ICD-10-CM | POA: Insufficient documentation

## 2017-04-15 DIAGNOSIS — G4733 Obstructive sleep apnea (adult) (pediatric): Secondary | ICD-10-CM

## 2017-04-15 NOTE — Procedures (Signed)
  Patient Name: Malta, Dayan Study Date: 04/08/2017 Gender: Male D.O.B: 12/25/1945 Age (years): [age] [address] Provider: Jetty Duhamellinton Elier Zellars MD, ABSM Height (inches): 70 Interpreting Physician: Jetty Duhamellinton Kutler Vanvranken MD, ABSM Weight (lbs): 225 RPSGT: Armen PickupFord, Evelyn BMI: 32 MRN: [id number] Neck Size: 17.00 CLINICAL INFORMATION The patient is referred for a CPAP/BiPAP titration to treat sleep apnea.  Date of NPSG, Split Night or HST: NPSG 12/19/92 AHI 72/ hr, body weight 250 lbs.  Unattended HST 03/11/16- AHI 9.9/ hr, body weight 250 lbs  SLEEP STUDY TECHNIQUE As per the AASM Manual for the Scoring of Sleep and Associated Events v2.3 (April 2016) with a hypopnea requiring 4% desaturations.  The channels recorded and monitored were frontal, central and occipital EEG, electrooculogram (EOG), submentalis EMG (chin), nasal and oral airflow, thoracic and abdominal wall motion, anterior tibialis EMG, snore microphone, electrocardiogram, and pulse oximetry. Bilevel positive airway pressure (BPAP) was initiated at the beginning of the study and titrated to treat sleep-disordered breathing.  MEDICATIONS Medications self-administered by patient taken the night of the study : none reported  RESPIRATORY PARAMETERS Optimal IPAP Pressure (cm): 13 AHI at Optimal Pressure (/hr) 0.6 Optimal EPAP Pressure (cm): 9   Overall Minimal O2 (%): 83.00 Minimal O2 at Optimal Pressure (%): 89.0  SLEEP ARCHITECTURE Start Time: 10:38:11 PM Stop Time: 4:30:43 AM Total Time (min): 352.5 Total Sleep Time (min): 323.0 Sleep Latency (min): 9.8 Sleep Efficiency (%): 91.6 REM Latency (min): 72.5 WASO (min): 19.7 Stage N1 (%): 2.79 Stage N2 (%): 68.27 Stage N3 (%): 0.00 Stage R (%): 28.95 Supine (%): 100.00 Arousal Index (/hr): 10.2      CARDIAC DATA The 2 lead EKG demonstrated sinus rhythm. The mean heart rate was 83.51 beats per minute. Other EKG findings include: None.  LEG MOVEMENT DATA The total Periodic Limb Movements of Sleep  (PLMS) were 248. The PLMS index was 46.07. A PLMS index of <15 is considered normal in adults.  IMPRESSIONS - CPAP control inadequate at tolerated pressure. An optimal BiPAP pressure was selected for this patient ( 13 / 9 cm of water) - Central sleep apnea was not noted during this titration (CAI = 0.0/h). - Moderate oxygen desaturations were observed during this titration (min O2 = 83.00%). - No snoring was audible at therapeutic pressure during this study. - No cardiac abnormalities were observed during this study. - Moderate periodic limb movements were observed during this study. Arousals associated with PLMs were rare.  DIAGNOSIS - Obstructive Sleep Apnea (327.23 [G47.33 ICD-10])  RECOMMENDATIONS - Trial of BiPAP therapy on 13/9 cm H2O with a Medium size Philips Respironics Nasal Pillow Mask Nuance Pro Gel mask and heated humidification. - Avoid alcohol, sedatives and other CNS depressants that may worsen sleep apnea and disrupt normal sleep architecture. - Sleep hygiene should be reviewed to assess factors that may improve sleep quality. - Weight management and regular exercise should be initiated or continued.  [Electronically signed] 04/15/2017 01:48 PM  Jetty Duhamellinton Humberto Addo MD, ABSM Diplomate, American Board of Sleep Medicine   NPI: [id number] Waymon BudgeYOUNG,Ascencion Coye D Diplomate, American Board of Sleep Medicine  ELECTRONICALLY SIGNED ON:  04/15/2017, 1:42 PM [facility] PH: [phone]   FX: [phone] [id number] ACCREDITED BY THE AMERICAN ACADEMY OF SLEEP MEDICINE

## 2017-04-24 ENCOUNTER — Telehealth: Payer: Self-pay | Admitting: Internal Medicine

## 2017-04-24 DIAGNOSIS — G4733 Obstructive sleep apnea (adult) (pediatric): Secondary | ICD-10-CM

## 2017-04-24 NOTE — Telephone Encounter (Signed)
Spoke with patient about CY's recommendations. Will place order for replacement VPAP machine. Patient DOES NOT want to use [facility] anymore. Advised him that [facility] [place] [facility] [place] Patient verbalized understanding. Advised patient to call us back within a week if he has not heard anything.

## 2017-04-24 NOTE — Telephone Encounter (Signed)
Ok to help him change DME companies with order to continue therapy for OSA with replacement for old VPAP machine, IMAX 13,  EMAX 9,  PS 4, mask of choice, humidifier, supplies, AirView    Dx OSA I don't have information on specific machines in this category- he can discuss with DME.

## 2017-04-24 NOTE — Telephone Encounter (Signed)
Pt is aware of results and voiced his understanding. Pt would like recommendations on the best machine. Pt also states he would like order to be sent to different [facility], but someone that is local. Will place order after recommendations.   CY please advise. Thanks.    Notes recorded by Waymon BudgeYoung, Clinton D, MD on 04/17/2017 at 3:14 PM EDT Please let patient know that his sleep study showed good control with bilevel pressures of 13/ 9. This study should serve to qualify him for the replacement machine that he needs.  Please order DME- replacement for old VPAP machine : VPAP IMAX 13, EMAX 9, PS 4   Dx OSA   Based on sleep study from 04/15/17   There has been no break in therapy.

## 2017-05-19 ENCOUNTER — Encounter (HOSPITAL_BASED_OUTPATIENT_CLINIC_OR_DEPARTMENT_OTHER): Payer: Medicare Other

## 2017-06-22 ENCOUNTER — Telehealth: Payer: Self-pay | Admitting: Internal Medicine

## 2017-06-22 DIAGNOSIS — G4733 Obstructive sleep apnea (adult) (pediatric): Secondary | ICD-10-CM

## 2017-06-22 NOTE — Telephone Encounter (Signed)
Called and spoke with pt and he is aware to call [facility] to find out about their pricing as well.  He is aware of the order that has been sent in for the BIPAP.

## 2017-06-22 NOTE — Addendum Note (Signed)
Addended by: Velvet BatheAULFIELD, Eriyana Sweeten L on: 06/22/2017 03:30 PM   Modules accepted: Orders

## 2017-06-22 NOTE — Telephone Encounter (Signed)
Pt called back, requesting that we send his bipap order to [facility].  This has been sent.  Nothing further needed.

## 2017-08-30 ENCOUNTER — Encounter: Payer: Self-pay | Admitting: Internal Medicine

## 2017-08-31 ENCOUNTER — Ambulatory Visit (INDEPENDENT_AMBULATORY_CARE_PROVIDER_SITE_OTHER): Payer: Medicare Other | Admitting: Internal Medicine

## 2017-08-31 ENCOUNTER — Encounter: Payer: Self-pay | Admitting: Internal Medicine

## 2017-08-31 DIAGNOSIS — G4733 Obstructive sleep apnea (adult) (pediatric): Secondary | ICD-10-CM

## 2017-08-31 NOTE — Progress Notes (Signed)
Subjective:    Patient ID: CASHEL BELLINA, male    DOB: June 30, 1946, 71 y.o.   MRN: [id number]  HPI male never smoker followed for OSA/ UPPP, complicated by history of staph dermatitis NPSG 12/19/92- AHI 72/ hr, desaturation to 92%, body weight 250 lbs Unattended Home Sleep Test 03/11/2016-AHI 9.9/hour, desaturation to 86%, body weight 228 pounds CPAP titration study 04/08/17- CPAP control was inadequate at tolerated pressures so he was titrated with BiPAP to 13/9 -----------------------------------------------------------------------------------------------  02/09/2017-71 year old male never smoker followed for OSA/ UPPP, complicated by history of staph dermatitis VPAP I 15, E 5, PS 4/ [facility] told him he would have to have a CPAP titration study demonstrating that BiPAP was required for a new BiPAP machine could be provided. FOLLOWS FOR: DME:[facility]. Pt states he is being told by [facility] that he is not a pt of theirs (message sent to [facility] about this). Pt then states that BiPAP not being covered by insurance due to no records from [facility]. Pt has CPAP titration in the past -prior to Medicare.  CPAP titration in 1994 had indicated 12 CWP He was changed from CPAP to VPAP in 2012 apparently on our order to his DME company. At that time a qualifying study apparently was not done-we have no record. Now he needs replacement machine and Medicare rules are requiring he have a study documenting that CPAP is insufficient. I have tried sending a renewal order to [facility], but they're saying this does not provide needed documentation. He has been very compliant with his VPAP and definitely feels that he sleeps better with it.  08/31/17- 71 year old male never smoker followed for OSA/ UPPP, complicated by history of staph dermatitis BIPAP max IPAP 13, min EPAP 9, PS 9/ [facility] CPAP titration study 04/08/17- CPAP control was inadequate at tolerated pressures so he was titrated with BiPAP to 13/9 FOLLOWS FOR: DME: [facility]; Pt will  need order to [facility] for new mask. Pt wears BiPAP; DL attached.  He got a new BiPAP machine this spring to replace old one and it seems to be working well. He has had trouble with mask fit and frustrated that [facility] doesn't seem to have inventory for rapid replacement of the mask he likes. Also having more problems with dry mouth and humidifier running dry by morning which suggests possible leak. He thinks he feels a little sleepier during the day. Uses a nasal mask with chinstrap. Has So Clean machine. Falls asleep quickly and sleeps through the night using white noise earplugs. CPAP download shows 56% compliance because when he goes out of town he takes his older machine. AHI 0.4/hour.  Review of Systems, See HPI    + = positive Constitutional:   No-   weight loss, night sweats, fevers, chills, fatigue, lassitude. HEENT:   No-  headaches, difficulty swallowing, tooth/dental problems, sore throat,       No-  sneezing, itching, ear ache, nasal congestion, post nasal drip,  CV:  No-   chest pain, orthopnea, PND, swelling in lower extremities, anasarca, dizziness, palpitations Resp: No-   shortness of breath with exertion or at rest.              No-   productive cough,  No non-productive cough,  No-  coughing up of blood.              No-   change in color of mucus.  No- wheezing.   Skin:  GI:  No-   heartburn, indigestion, abdominal pain, nausea,  vomiting, GU: . MS:  No-   joint pain or swelling.   Neuro- nothing unusual  Psych:  No- change in mood or affect. No depression or anxiety.  No memory loss.  Objective:   Physical Exam General- Alert, Oriented, Affect-appropriate, Distress- none acute , + overweight Skin- no rash Lymphadenopathy- none Head- atraumatic            Eyes- Gross vision intact, PERRLA, conjunctivae clear secretions            Ears- Hearing, canals- normal            Nose- Clear, no -Septal dev, mucus+, polyps, erosion, perforation;                  Throat- +s/p UPPP ,  mucosa -clear, drainage- none, tonsils- atrophic Neck- flexible , trachea midline, no stridor , thyroid nl, carotid no bruit Chest - symmetrical excursion , unlabored           Heart/CV- RRR , no murmur , no gallop  , no rub, nl s1 s2                           - JVD- none , edema- none, stasis changes- none, varices- none           Lung- clear to P&A, wheeze- none, cough- none , dullness-none, rub- none           Chest wall-  Abd- Br/ Gen/ Rectal- Not done, not indicated Extrem- cyanosis- none, clubbing, none, atrophy- none, strength- nl Neuro- grossly intact to observation   Assessment & Plan:

## 2017-08-31 NOTE — Patient Instructions (Signed)
We can continue VPAP 13/ 9, PS 9    Mask of choice, supplies, humidifier, supplies AirView     Dx OSA   We did discuss on-line supplies from [email or web address]  Please call if we can help

## 2017-08-31 NOTE — Assessment & Plan Note (Signed)
Control is good. He uses an old CPAP machine which is packaged conveniently for travel, when he goes out of town but is using one of his machines every night. We think he is having excessive leak because of mask fit, and this is causing humidifier to run dry. We have contacted [facility] for him. I'm suggesting he take both machines and masks by appointment, to [facility] so that they can look at them and try to help him figure how to be more comfortable.

## 2017-12-14 ENCOUNTER — Telehealth: Payer: Self-pay | Admitting: Internal Medicine

## 2017-12-14 NOTE — Telephone Encounter (Signed)
[facility] please advise if you've received CMN for this order.  Thanks.

## 2017-12-15 NOTE — Telephone Encounter (Signed)
Zachary Glass had to call [facility] to get the CMN she has taken it to [facility] to sign

## 2017-12-15 NOTE — Telephone Encounter (Signed)
Synetta Failnita please advise once the CMN has been sent in for the pt to get his supplies. Thanks

## 2017-12-16 NOTE — Telephone Encounter (Signed)
This CMN was signed by Dr. Maple HudsonYoung on 1/15 and I faxed it to [facility] 12/16/2017 with confirmation that it was received

## 2018-02-09 ENCOUNTER — Ambulatory Visit (INDEPENDENT_AMBULATORY_CARE_PROVIDER_SITE_OTHER): Payer: Medicare Other | Admitting: Internal Medicine

## 2018-02-09 ENCOUNTER — Encounter: Payer: Self-pay | Admitting: Internal Medicine

## 2018-02-09 DIAGNOSIS — G4733 Obstructive sleep apnea (adult) (pediatric): Secondary | ICD-10-CM

## 2018-02-09 NOTE — Assessment & Plan Note (Signed)
He has been benefiting from CPAP and download confirms good compliance and control.  He uses CPAP every night with occasional gaps when he uses a different machine out of town.  Currently he is dealing with some billing and record-keeping problems with his current DME office.  When those are resolved [facility]ates [facility] [place]. Plan-continue BiPAP 13/9, PS 4

## 2018-02-09 NOTE — Progress Notes (Signed)
Subjective:    Patient ID: Zachary Glass, male    DOB: 05-11-1946, 72 y.o.   MRN: [id number]  HPI male never smoker followed for OSA/ UPPP, complicated by history of staph dermatitis NPSG 12/19/92- AHI 72/ hr, desaturation to 92%, body weight 250 lbs Unattended Home Sleep Test 03/11/2016-AHI 9.9/hour, desaturation to 86%, body weight 228 pounds CPAP titration study 04/08/17- CPAP control was inadequate at tolerated pressures so he was titrated with BiPAP to 13/9 -----------------------------------------------------------------------------------------------  08/31/17- 72 year old male never smoker followed for OSA/ UPPP, complicated by history of staph dermatitis BIPAP max IPAP 13, min EPAP 9, PS 9/ [facility] CPAP titration study 04/08/17- CPAP control was inadequate at tolerated pressures so he was titrated with BiPAP to 13/9 FOLLOWS FOR: DME: [facility]; Pt will need order to [facility] for new mask. Pt wears BiPAP; DL attached.  He got a new BiPAP machine this spring to replace old one and it seems to be working well. He has had trouble with mask fit and frustrated that [facility] doesn't seem to have inventory for rapid replacement of the mask he likes. Also having more problems with dry mouth and humidifier running dry by morning which suggests possible leak. He thinks he feels a little sleepier during the day. Uses a nasal mask with chinstrap. Has So Clean machine. Falls asleep quickly and sleeps through the night using white noise earplugs. BiPAP download shows 56% compliance because when he goes out of town he takes his older machine. AHI 0.4/hour.  02/09/18- 72 year old male never smoker followed for OSA/ UPPP, complicated by history of staph dermatitis BIPAP max IPAP 13, min EPAP 9, PS 4/ [facility] -----OSA; DME: [facility] [place]; Pt wears CPAP nightly for the most part. DL attached. Pt states he is speaking [place] [facility] office to change over. Pt has had trouble with humidifer on CPAP as well since November-still not fixed.  He is  staying for now with [facility]/ [facility] in [place] until he gets some billing issues cleared up.  He still uses an older machine when he is out of town.  In recent weeks has noted more yawning in the afternoons since he got his current machine.  He does not recognize a change in sleep time or quality and is not taking sedating meds.  Denies any problem driving. Download shows 87% compliance, AHI 0.4/hour.  Nasal pillows mask and chinstrap.  Review of Systems, See HPI    + = positive Constitutional:   No-   weight loss, night sweats, fevers, chills, fatigue, lassitude. HEENT:   No-  headaches, difficulty swallowing, tooth/dental problems, sore throat,       No-  sneezing, itching, ear ache, nasal congestion, post nasal drip,  CV:  No-   chest pain, orthopnea, PND, swelling in lower extremities, anasarca, dizziness, palpitations Resp: No-   shortness of breath with exertion or at rest.              No-   productive cough,  No non-productive cough,  No-  coughing up of blood.              No-   change in color of mucus.  No- wheezing.   Skin:  GI:  No-   heartburn, indigestion, abdominal pain, nausea, vomiting, GU: . MS:  No-   joint pain or swelling.   Neuro- nothing unusual  Psych:  No- change in mood or affect. No depression or anxiety.  No memory loss.  Objective:   Physical Exam General- Alert, Oriented,  Affect-appropriate, Distress- none acute , + overweight Skin- no rash Lymphadenopathy- none Head- atraumatic            Eyes- Gross vision intact, PERRLA, conjunctivae clear secretions            Ears- Hearing, canals- normal            Nose- Clear, no -Septal dev, mucus+, polyps, erosion, perforation;                  Throat- +s/p UPPP , mucosa -clear, drainage- none, tonsils- atrophic Neck- flexible , trachea midline, no stridor , thyroid nl, carotid no bruit Chest - symmetrical excursion , unlabored           Heart/CV- RRR , no murmur , no gallop  , no rub, nl s1 s2                            - JVD- none , edema- none, stasis changes- none, varices- none           Lung- clear to P&A, wheeze- none, cough- none , dullness-none, rub- none           Chest wall-  Abd- Br/ Gen/ Rectal- Not done, not indicated Extrem- cyanosis- none, clubbing, none, atrophy- none, strength- nl Neuro- grossly intact to observation   Assessment & Plan:

## 2018-02-09 NOTE — Patient Instructions (Signed)
We can continue BIPAP 13/9 PS 4, with supplies, humidifier, AirView   Please let us know if we can help

## 2019-02-06 ENCOUNTER — Encounter: Payer: Self-pay | Admitting: Internal Medicine

## 2019-02-08 ENCOUNTER — Encounter: Payer: Self-pay | Admitting: Internal Medicine

## 2019-02-08 ENCOUNTER — Ambulatory Visit (INDEPENDENT_AMBULATORY_CARE_PROVIDER_SITE_OTHER): Payer: Medicare Other | Admitting: Internal Medicine

## 2019-02-08 DIAGNOSIS — G4733 Obstructive sleep apnea (adult) (pediatric): Secondary | ICD-10-CM | POA: Diagnosis not present

## 2019-02-08 NOTE — Assessment & Plan Note (Signed)
I discussed alternative to CPAP, and availability of supplies from on-line if he wishes. He is still trying to work with DME about his claim for wager damage from humidifier leak.  Plan- Continue BIPAP Imax 13, MinE 9, PS 4. Ok to order replacement mask of choice and supplies when he calls for them.

## 2019-02-08 NOTE — Patient Instructions (Signed)
When you are ready, let us know so we can order replacement CPAP mask of choice, humidifier, supplies, Airview/ card  Please call if we can help

## 2019-02-08 NOTE — Progress Notes (Signed)
Subjective:    Patient ID: Zachary Glass, male    DOB: 13-Nov-1946, 73 y.o.   MRN: [id number]  HPI male never smoker followed for OSA/ UPPP, complicated by history of staph dermatitis NPSG 12/19/92- AHI 72/ hr, desaturation to 92%, body weight 250 lbs Unattended Home Sleep Test 03/11/2016-AHI 9.9/hour, desaturation to 86%, body weight 228 pounds CPAP titration study 04/08/17- CPAP control was inadequate at tolerated pressures so he was titrated with BiPAP to 13/9 ----------------------------------------------------------------------------------------------- 02/09/18- 73 year old male never smoker followed for OSA/ UPPP, complicated by history of staph dermatitis BIPAP max IPAP 13, min EPAP 9, PS 4/ [facility] -----OSA; DME: [facility] [place]; Pt wears CPAP nightly for the most part. DL attached. Pt states he is speaking [place] [facility] office to change over. Pt has had trouble with humidifer on CPAP as well since November-still not fixed.  He is staying for now with [facility] in [place] until he gets some billing issues cleared up.  He still uses an older machine when he is out of town.  In recent weeks has noted more yawning in the afternoons since he got his current machine.  He does not recognize a change in sleep time or quality and is not taking sedating meds.  Denies any problem driving. Download shows 87% compliance, AHI 0.4/hour.  Nasal pillows mask and chinstrap.  02/08/2019- 73 year old male never smoker followed for OSA/ UPPP, complicated by history of staph dermatitis BIPAP max IPAP 13, min EPAP 9, PS 4/ [facility] Download 100% compliance, AHI 0.3/ hr Body weight today 218 lbs. BP 160/ 98 (He states "white coat syndrome") Machine is only about a year old.  He is still trying to get [facility] to pay for water damage to an end table caused by humidifier leak a year ago. He hasn't wanted any financial transaction with the DME , including replacing mask and supplies, until this issue is resolved.  Tolerated R TKR  without problems in January. We discussed oral appliance as an alternative to CPAP, mainly for people whose baseline apnea is mild- his original AHI was 72/ hr. Wife doesn't like the noise of his CPAP/  Review of Systems, See HPI    + = positive Constitutional:   No-   weight loss, night sweats, fevers, chills, fatigue, lassitude. HEENT:   No-  headaches, difficulty swallowing, tooth/dental problems, sore throat,       No-  sneezing, itching, ear ache, nasal congestion, post nasal drip,  CV:  No-   chest pain, orthopnea, PND, swelling in lower extremities, anasarca, dizziness, palpitations Resp: No-   shortness of breath with exertion or at rest.              No-   productive cough,  No non-productive cough,  No-  coughing up of blood.              No-   change in color of mucus.  No- wheezing.   Skin:  GI:  No-   heartburn, indigestion, abdominal pain, nausea, vomiting, GU: . MS:  No-   joint pain or swelling.   Neuro- nothing unusual  Psych:  No- change in mood or affect. No depression or anxiety.  No memory loss.  Objective:   Physical Exam General- Alert, Oriented, Affect-appropriate, Distress- none acute , + overweight Skin- no rash Lymphadenopathy- none Head- atraumatic            Eyes- Gross vision intact, PERRLA, conjunctivae clear secretions  Ears- Hearing, canals- normal            Nose- Clear, no -Septal dev, mucus+, polyps, erosion, perforation;                  Throat- +s/p UPPP , mucosa -clear, drainage- none, tonsils- atrophic Neck- flexible , trachea midline, no stridor , thyroid nl, carotid no bruit Chest - symmetrical excursion , unlabored           Heart/CV- RRR , no murmur , no gallop  , no rub, nl s1 s2                           - JVD- none , edema- none, stasis changes- none, varices- none           Lung- clear to P&A, wheeze- none, cough- none , dullness-none, rub- none           Chest wall-  Abd- Br/ Gen/ Rectal- Not done, not indicated Extrem-  cyanosis- none, clubbing, none, atrophy- none, strength- nl Neuro- grossly intact to observation   Assessment & Plan:

## 2020-01-09 ENCOUNTER — Encounter: Payer: Self-pay | Admitting: Internal Medicine

## 2020-02-08 ENCOUNTER — Ambulatory Visit (INDEPENDENT_AMBULATORY_CARE_PROVIDER_SITE_OTHER): Payer: Medicare Other | Admitting: Internal Medicine

## 2020-02-08 ENCOUNTER — Encounter: Payer: Self-pay | Admitting: Internal Medicine

## 2020-02-08 ENCOUNTER — Other Ambulatory Visit: Payer: Self-pay

## 2020-02-08 VITALS — BP 162/82 | HR 92 | Temp 97.2°F | Ht 70.0 in | Wt 222.6 lb

## 2020-02-08 DIAGNOSIS — G4733 Obstructive sleep apnea (adult) (pediatric): Secondary | ICD-10-CM | POA: Diagnosis not present

## 2020-02-08 NOTE — Assessment & Plan Note (Signed)
Normalizing body weight will help with his OSA control

## 2020-02-08 NOTE — Patient Instructions (Signed)
We can continue BIPAP 13/ 9 PS 4, mask of choice, humidifier, supplies, AirView card  Order- Print Script    CPAP mask of choice and supplies    Dx OSA  Please call if we can help

## 2020-02-08 NOTE — Assessment & Plan Note (Signed)
Benefits with good compliance and control documented by download. Plan- he is given printed script for use at an on-line source such as [employer].com if he needs to go outside DME for his preferred mask.

## 2020-02-08 NOTE — Progress Notes (Signed)
Subjective:    Patient ID: Zachary Glass, male    DOB: Oct 02, 1946, 74 y.o.   MRN: [id number]  HPI male never smoker followed for OSA/ UPPP, complicated by history of staph dermatitis NPSG 12/19/92- AHI 72/ hr, desaturation to 92%, body weight 250 lbs Unattended Home Sleep Test 03/11/2016-AHI 9.9/hour, desaturation to 86%, body weight 228 pounds CPAP titration study 04/08/17- CPAP control was inadequate at tolerated pressures so he was titrated with BiPAP to 13/9 -----------------------------------------------------------------------------------------------  02/08/2019- 74 year old male never smoker followed for OSA/ UPPP, complicated by history of staph dermatitis BIPAP max IPAP 13, min EPAP 9, PS 4/ [facility] Download 100% compliance, AHI 0.3/ hr Body weight today 218 lbs. BP 160/ 98 (He states "white coat syndrome") Machine is only about a year old.  He is still trying to get [facility] to pay for water damage to an end table caused by humidifier leak a year ago. He hasn't wanted any financial transaction with the DME , including replacing mask and supplies, until this issue is resolved.  Tolerated R TKR without problems in January. We discussed oral appliance as an alternative to CPAP, mainly for people whose baseline apnea is mild- his original AHI was 72/ hr. Wife doesn't like the noise of his CPAP/  02/08/20- 74 year old male never smoker followed for OSA/ UPPP, complicated by history of staph dermatitis BIPAP max IPAP 13, min EPAP 9, PS 4/ [facility] Download compliance 100%, AHI 0.2/ hr Body weight today 222 lbs Had both Moderna Covid vax. Has moved DME service to [facility] after problems with previous branch office. Discussed use of on-line source for his preferred mask style.  Review of Systems, See HPI    + = positive Constitutional:   No-   weight loss, night sweats, fevers, chills, fatigue, lassitude. HEENT:   No-  headaches, difficulty swallowing, tooth/dental problems, sore  throat,       No-  sneezing, itching, ear ache, nasal congestion, post nasal drip,  CV:  No-   chest pain, orthopnea, PND, swelling in lower extremities, anasarca, dizziness, palpitations Resp: No-   shortness of breath with exertion or at rest.              No-   productive cough,  No non-productive cough,  No-  coughing up of blood.              No-   change in color of mucus.  No- wheezing.   Skin:  GI:  No-   heartburn, indigestion, abdominal pain, nausea, vomiting, GU: . MS:  No-   joint pain or swelling.   Neuro- nothing unusual  Psych:  No- change in mood or affect. No depression or anxiety.  No memory loss.  Objective:   Physical Exam General- Alert, Oriented, Affect-appropriate, Distress- none acute , + overweight Skin- no rash Lymphadenopathy- none Head- atraumatic            Eyes- Gross vision intact, PERRLA, conjunctivae clear secretions            Ears- Hearing, canals- normal            Nose- Clear, no -Septal dev, mucus+, polyps, erosion, perforation;                  Throat- +s/p UPPP , mucosa -clear, drainage- none, tonsils- atrophic Neck- flexible , trachea midline, no stridor , thyroid nl, carotid no bruit Chest - symmetrical excursion , unlabored  Heart/CV- RRR , no murmur , no gallop  , no rub, nl s1 s2                           - JVD- none , edema- none, stasis changes- none, varices- none           Lung- clear to P&A, wheeze- none, cough- none , dullness-none, rub- none           Chest wall-  Abd- Br/ Gen/ Rectal- Not done, not indicated Extrem- cyanosis- none, clubbing, none, atrophy- none, strength- nl Neuro- grossly intact to observation   Assessment & Plan:

## 2020-09-04 ENCOUNTER — Telehealth: Payer: Self-pay | Admitting: Internal Medicine

## 2020-09-04 NOTE — Telephone Encounter (Signed)
Called and spoke with pt. Pt wants to know if he could be eligible to receive a new cpap machine. Stated to pt to contact DME and insurance companies to see if he is eligible and if they say that he is eligible to call our office back and we would then send that info to [facility]. Pt verbalized understanding. Nothing further needed.

## 2021-02-06 NOTE — Progress Notes (Signed)
Subjective:    Patient ID: Zachary Glass, male    DOB: 1946/11/24, 75 y.o.   MRN: [id number]  HPI male never smoker followed for OSA/ UPPP, complicated by history of staph dermatitis NPSG 12/19/92- AHI 72/ hr, desaturation to 92%, body weight 250 lbs Unattended Home Sleep Test 03/11/2016-AHI 9.9/hour, desaturation to 86%, body weight 228 pounds CPAP titration study 04/08/17- CPAP control was inadequate at tolerated pressures so he was titrated with BiPAP to 13/9 -----------------------------------------------------------------------------------------------   02/08/20- 75 year old male never smoker followed for OSA/ UPPP, complicated by history of staph dermatitis BIPAP max IPAP 13, min EPAP 9, PS 4/ [facility] Download compliance 100%, AHI 0.2/ hr Body weight today 222 lbs Had both Moderna Covid vax. Has moved DME service to [facility] after problems with previous branch office. Discussed use of on-line source for his preferred mask style.  02/06/21- 75 year old male never smoker followed for OSA/ UPPP, complicated by history of staph dermatitis BIPAP max IPAP 13, min EPAP 9, PS 4/ [facility] Download- compliance 100%, AHI 0.3/ hr Body weight today-210 lbs Covid vax- 3 Moderna Flu vax- -----Using the BIPAP.  In the last 3-4 months---he said that the reservoir has been dry every morning.  Drying out his sinuses. He is not sure what the issue is. Discussed peripheral edema. No hx DVT.   Review of Systems, See HPI    + = positive Constitutional:   No-   weight loss, night sweats, fevers, chills, fatigue, lassitude. HEENT:   No-  headaches, difficulty swallowing, tooth/dental problems, sore throat,       No-  sneezing, itching, ear ache, nasal congestion, post nasal drip,  CV:  No-   chest pain, orthopnea, PND, +swelling in lower extremities, anasarca, dizziness, palpitations Resp: No-   shortness of breath with exertion or at rest.              No-   productive cough,  No non-productive  cough,  No-  coughing up of blood.              No-   change in color of mucus.  No- wheezing.   Skin:  GI:  No-   heartburn, indigestion, abdominal pain, nausea, vomiting, GU: . MS:  No-   joint pain or swelling.   Neuro- nothing unusual  Psych:  No- change in mood or affect. No depression or anxiety.  No memory loss.  Objective:   Physical Exam General- Alert, Oriented, Affect-appropriate, Distress- none acute , + overweight Skin- no rash Lymphadenopathy- none Head- atraumatic            Eyes- Gross vision intact, PERRLA, conjunctivae clear secretions            Ears- Hearing, canals- normal            Nose- Clear, no -Septal dev, mucus+, polyps, erosion, perforation;                  Throat- +s/p UPPP , mucosa -clear, drainage- none, tonsils- atrophic Neck- flexible , trachea midline, no stridor , thyroid nl, carotid no bruit Chest - symmetrical excursion , unlabored           Heart/CV- RRR , no murmur , no gallop  , no rub, nl s1 s2                           - JVD- none , edema+3/ elastic hose, stasis changes- none visible, varices- none  Lung- clear to P&A, wheeze- none, cough- none , dullness-none, rub- none           Chest wall-  Abd- Br/ Gen/ Rectal- Not done, not indicated Extrem- cyanosis- none, clubbing, none, atrophy- none, strength- nl Neuro- grossly intact to observation   Assessment & Plan:

## 2021-02-07 ENCOUNTER — Encounter: Payer: Self-pay | Admitting: Internal Medicine

## 2021-02-07 ENCOUNTER — Ambulatory Visit (INDEPENDENT_AMBULATORY_CARE_PROVIDER_SITE_OTHER): Payer: Medicare Other | Admitting: Internal Medicine

## 2021-02-07 ENCOUNTER — Other Ambulatory Visit: Payer: Self-pay

## 2021-02-07 VITALS — BP 138/86 | HR 102 | Temp 98.1°F | Ht 70.0 in | Wt 210.1 lb

## 2021-02-07 DIAGNOSIS — R609 Edema, unspecified: Secondary | ICD-10-CM

## 2021-02-07 DIAGNOSIS — G4733 Obstructive sleep apnea (adult) (pediatric): Secondary | ICD-10-CM | POA: Diagnosis not present

## 2021-02-07 NOTE — Patient Instructions (Signed)
Order- DME [facility]- please service machine- humidifier has begun using too much water. Continue 13/9, PS 4  Please call if we can help

## 2021-06-21 DIAGNOSIS — R609 Edema, unspecified: Secondary | ICD-10-CM | POA: Insufficient documentation

## 2021-06-21 DIAGNOSIS — R600 Localized edema: Secondary | ICD-10-CM | POA: Insufficient documentation

## 2021-06-21 NOTE — Assessment & Plan Note (Signed)
Likely weight and peripheral venous insufficiency. I advised him to discuss this with his PCP, considering possible referral and labs.

## 2021-06-21 NOTE — Assessment & Plan Note (Signed)
Benefits from BIPAP with good compliance and control.  Plan- work with [facility] on humidifier and comfort adjustments

## 2022-05-15 ENCOUNTER — Encounter: Payer: Self-pay | Admitting: Internal Medicine

## 2022-05-17 NOTE — Progress Notes (Signed)
Subjective:    Patient ID: Zachary Glass, male    DOB: 1946-04-13, 76 y.o.   MRN: [id number]  HPI male never smoker followed for OSA/ UPPP, complicated by history of staph dermatitis NPSG 12/19/92- AHI 72/ hr, desaturation to 92%, body weight 250 lbs Unattended Home Sleep Test 03/11/2016-AHI 9.9/hour, desaturation to 86%, body weight 228 pounds CPAP titration study 04/08/17- CPAP control was inadequate at tolerated pressures so he was titrated with BiPAP to 13/9 -----------------------------------------------------------------------------------------------   02/06/21- 76 year old male never smoker followed for OSA/ UPPP, complicated by history of staph dermatitis BIPAP max IPAP 13, min EPAP 9, PS 4/ [employer] Download- compliance 100%, AHI 0.3/ hr Body weight today-210 lbs Covid vax- 3 Moderna Flu vax- -----Using the BIPAP.  In the last 3-4 months---he said that the reservoir has been dry every morning.  Drying out his sinuses. He is not sure what the issue is. Discussed peripheral edema. No hx DVT.   05/19/22- 76 year old male never smoker followed for OSA/ UPPP, complicated by history of staph dermatitis, Obesity, BIPAP max IPAP 13, min EPAP 9, PS 4/ [employer] Download- compliance 100%, AHI 0.3/ hr Body weight today-211 lbs Covid vax- 3 Moderna Download reviewed. Doing well.  Wife died since last here. Condolences. Understandable stress and disruption. Chronic peripheral edema persists.  Review of Systems, See HPI    + = positive Constitutional:   No-   weight loss, night sweats, fevers, chills, fatigue, lassitude. HEENT:   No-  headaches, difficulty swallowing, tooth/dental problems, sore throat,       No-  sneezing, itching, ear ache, nasal congestion, post nasal drip,  CV:  No-   chest pain, orthopnea, PND, +swelling in lower extremities, anasarca, dizziness, palpitations Resp: No-   shortness of breath with exertion or at rest.              No-   productive cough,  No non-productive  cough,  No-  coughing up of blood.              No-   change in color of mucus.  No- wheezing.   Skin:  GI:  No-   heartburn, indigestion, abdominal pain, nausea, vomiting, GU: . MS:  No-   joint pain or swelling.   Neuro- nothing unusual  Psych:  No- change in mood or affect. No depression or anxiety.  No memory loss.  Objective:   Physical Exam General- Alert, Oriented, Affect-appropriate, Distress- none acute , + overweight Skin- +ecchymoses arms Lymphadenopathy- none Head- atraumatic            Eyes- Gross vision intact, PERRLA, conjunctivae clear secretions            Ears- Hearing, canals- normal            Nose- Clear, no -Septal dev, mucus+, polyps, erosion, perforation;                  Throat- +s/p UPPP , mucosa -clear, drainage- none, tonsils- atrophic Neck- flexible , trachea midline, no stridor , thyroid nl, carotid no bruit Chest - symmetrical excursion , unlabored           Heart/CV- RRR , no murmur , no gallop  , no rub, nl s1 s2                           - JVD- none , edema+3/ elastic hose, stasis changes- none visible, varices- none  Lung- +fine crackles, wheeze- none, cough- none , dullness-none, rub- none           Chest wall-  Abd- Br/ Gen/ Rectal- Not done, not indicated Extrem- cyanosis- none, clubbing, none, atrophy- none, strength- nl Neuro- grossly intact to observation   Assessment & Plan:

## 2022-05-19 ENCOUNTER — Encounter: Payer: Self-pay | Admitting: Internal Medicine

## 2022-05-19 ENCOUNTER — Ambulatory Visit (INDEPENDENT_AMBULATORY_CARE_PROVIDER_SITE_OTHER): Payer: Medicare Other | Admitting: Internal Medicine

## 2022-05-19 ENCOUNTER — Ambulatory Visit (INDEPENDENT_AMBULATORY_CARE_PROVIDER_SITE_OTHER): Payer: Medicare Other

## 2022-05-19 VITALS — BP 122/72 | HR 89 | Temp 98.2°F | Ht 70.5 in | Wt 211.0 lb

## 2022-05-19 DIAGNOSIS — J849 Interstitial pulmonary disease, unspecified: Secondary | ICD-10-CM

## 2022-05-19 DIAGNOSIS — R609 Edema, unspecified: Secondary | ICD-10-CM

## 2022-05-19 DIAGNOSIS — G4733 Obstructive sleep apnea (adult) (pediatric): Secondary | ICD-10-CM

## 2022-05-19 NOTE — Patient Instructions (Signed)
We can continue BIPAP at current settings  Order- CXR   dx ILD  Please call if we can help

## 2022-05-26 ENCOUNTER — Telehealth: Payer: Self-pay | Admitting: Internal Medicine

## 2022-05-28 NOTE — Telephone Encounter (Signed)
Pt was made aware of cxr results by [facility]. Nothing further needed.

## 2022-06-29 NOTE — Assessment & Plan Note (Signed)
Benefits from BIPAP and settings remain appropriate Plan- continue 13/9

## 2022-06-29 NOTE — Assessment & Plan Note (Signed)
Wearing elastic hose. Managed elsewhere Note fine crackles on chest exam- possible mild pulmonary edema Plan- CXR

## 2023-05-19 NOTE — Progress Notes (Signed)
Subjective:    Patient ID: Zachary Glass, male    DOB: [birth year], 77 y.o.   MRN: [id number]  HPI male never smoker followed for OSA/ UPPP, complicated by history of staph dermatitis NPSG 12/19/92- AHI 72/ hr, desaturation to 92%, body weight 250 lbs Unattended Home Sleep Test 03/11/2016-AHI 9.9/hour, desaturation to 86%, body weight 228 pounds CPAP titration study 04/08/17- CPAP control was inadequate at tolerated pressures so he was titrated with BiPAP to 13/9 -----------------------------------------------------------------------------------------------   05/19/22- 77 year old male never smoker followed for OSA/ UPPP, complicated by history of staph dermatitis, Obesity, BIPAP max IPAP 13, min EPAP 9, PS 4/ [employer] Download- compliance 100%, AHI 0.3/ hr Body weight today-211 lbs Covid vax- 3 Moderna Download reviewed. Doing well.  Wife died since last here. Condolences. Understandable stress and disruption. Chronic peripheral edema persists.  05/21/23- 77 year old male never smoker followed for OSA/ UPPP, complicated by history of staph dermatitis, Obesity, Peripheral Venous Insufficiency,  BIPAP max IPAP 13, min EPAP 9, PS 4/ [employer] Download- compliance 100%, AHI 0.3/ hr Body weight today-205 lbs -----Wearing CPAP-using a lot of water with humidification Download reviewed.  Machine is old and we discussed replacement but he is doing well with it. Left lower leg is wrapped for stasis ulcer related to venous insufficiency. CXR 05/19/22- IMPRESSION: No acute process.  Review of Systems, See HPI    + = positive Constitutional:   No-   weight loss, night sweats, fevers, chills, fatigue, lassitude. HEENT:   No-  headaches, difficulty swallowing, tooth/dental problems, sore throat,       No-  sneezing, itching, ear ache, nasal congestion, post nasal drip,  CV:  No-   chest pain, orthopnea, PND, +swelling in lower extremities, anasarca, dizziness, palpitations Resp: No-   shortness of  breath with exertion or at rest.              No-   productive cough,  No non-productive cough,  No-  coughing up of blood.              No-   change in color of mucus.  No- wheezing.   Skin:  GI:  No-   heartburn, indigestion, abdominal pain, nausea, vomiting, GU: . MS:  No-   joint pain or swelling.   Neuro- nothing unusual  Psych:  No- change in mood or affect. No depression or anxiety.  No memory loss.  Objective:   Physical Exam General- Alert, Oriented, Affect-appropriate, Distress- none acute , + overweight Skin- +ecchymoses arms Lymphadenopathy- none Head- atraumatic            Eyes- Gross vision intact, PERRLA, conjunctivae clear secretions            Ears- Hearing, canals- normal            Nose- Clear, no -Septal dev, mucus+, polyps, erosion, perforation;                  Throat- +s/p UPPP , mucosa -clear, drainage- none, tonsils- atrophic Neck- flexible , trachea midline, no stridor , thyroid nl, carotid no bruit Chest - symmetrical excursion , unlabored           Heart/CV- RRR , no murmur , no gallop  , no rub, nl s1 s2                           - JVD- none , edema+3/ elastic hose, stasis changes- none visible, varices- none  Lung- +fclear wheeze- none, cough- none , dullness-none, rub- none           Chest wall-  Abd- Br/ Gen/ Rectal- Not done, not indicated Extrem- + L lower leg wrapped Neuro- grossly intact to observation   Assessment & Plan:

## 2023-05-21 ENCOUNTER — Ambulatory Visit (INDEPENDENT_AMBULATORY_CARE_PROVIDER_SITE_OTHER): Payer: Medicare Other | Admitting: Internal Medicine

## 2023-05-21 ENCOUNTER — Encounter: Payer: Self-pay | Admitting: Internal Medicine

## 2023-05-21 VITALS — BP 152/76 | HR 84 | Ht 70.0 in | Wt 205.2 lb

## 2023-05-21 DIAGNOSIS — R600 Localized edema: Secondary | ICD-10-CM | POA: Diagnosis not present

## 2023-05-21 DIAGNOSIS — I10 Essential (primary) hypertension: Secondary | ICD-10-CM | POA: Diagnosis not present

## 2023-05-21 DIAGNOSIS — G4733 Obstructive sleep apnea (adult) (pediatric): Secondary | ICD-10-CM | POA: Diagnosis not present

## 2023-05-21 NOTE — Patient Instructions (Signed)
Order- DME APS/ [employer]   Please replace old BIPAP machine 13/9, PS4  mask of choice, humidifier, supplies, AirView/ card  Please call if we can help

## 2023-06-26 ENCOUNTER — Encounter: Payer: Self-pay | Admitting: Internal Medicine

## 2023-06-26 DIAGNOSIS — I10 Essential (primary) hypertension: Secondary | ICD-10-CM | POA: Insufficient documentation

## 2023-06-26 NOTE — Assessment & Plan Note (Signed)
Leg wrapped for stasis ulcer

## 2023-06-26 NOTE — Assessment & Plan Note (Addendum)
Benefits from BiPAP with good compliance and control Plan-continue BiPAP 13/9, replace old BiPAP machine

## 2023-06-26 NOTE — Assessment & Plan Note (Signed)
152/ 76 today. For attention by PCP

## 2023-06-30 IMAGING — DX DG CHEST 2V
2 series · 2 of 2 positions shown · non-contrast
Comparison: None Available.

CLINICAL DATA: Interstitial lung disease

EXAM:
CHEST - 2 VIEW

[chest pa]
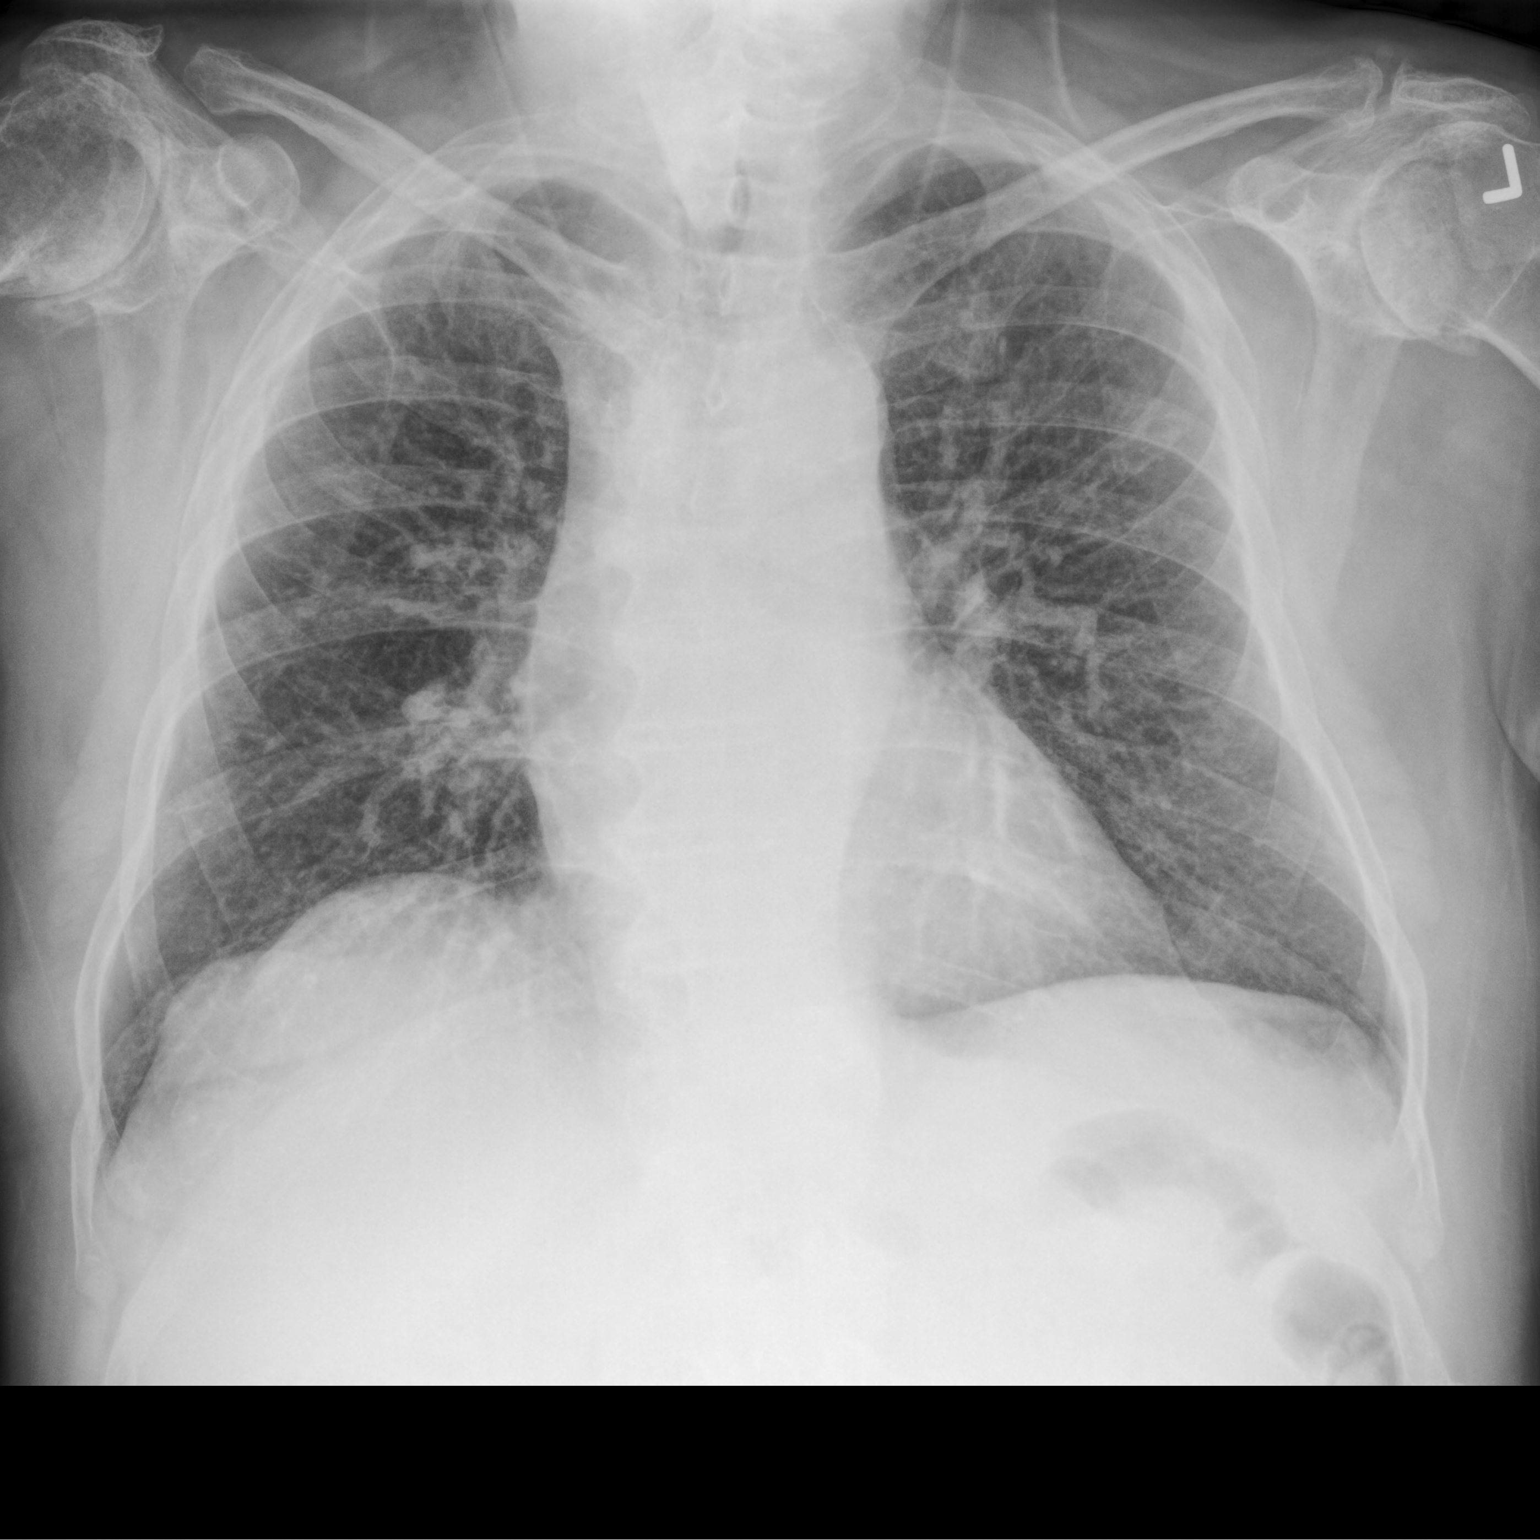

[chest lat]
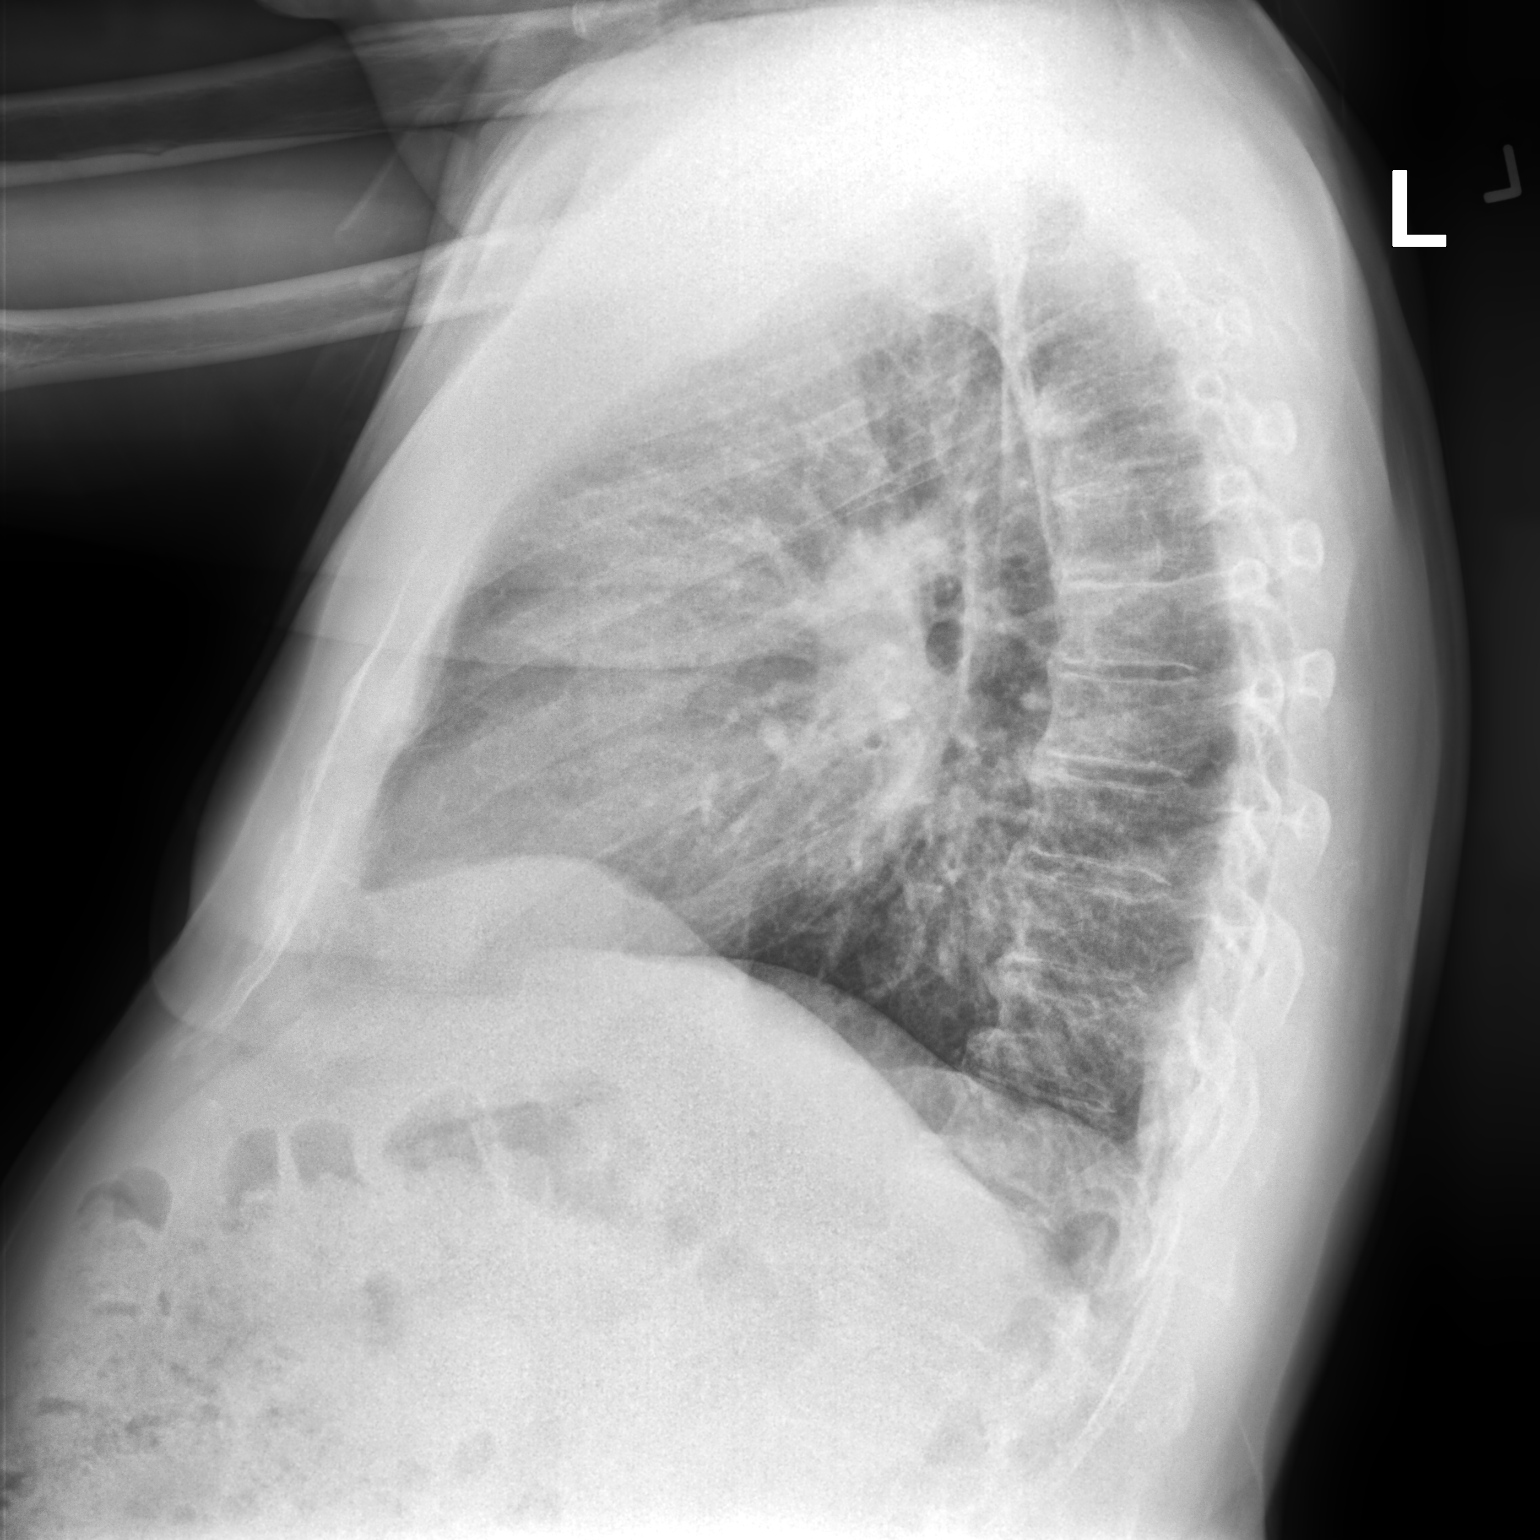

[2 of 2 positions shown; findings below may reference images not displayed]

FINDINGS: Normal cardiac and mediastinal contours. Right diaphragmatic
eventration. No large area pulmonary consolidation. No pleural
effusion or pneumothorax. Thoracic spine degenerative changes.
IMPRESSION: No acute process.

## 2024-05-19 NOTE — Progress Notes (Deleted)
 Subjective:    Patient ID: Zachary Glass, male    DOB: [birth year], 78 y.o.   MRN: [id number]  HPI male never smoker followed for OSA/ UPPP, complicated by history of staph dermatitis NPSG 12/19/92- AHI 72/ hr, desaturation to 92%, body weight 250 lbs Unattended Home Sleep Test 03/11/2016-AHI 9.9/hour, desaturation to 86%, body weight 228 pounds CPAP titration study 04/08/17- CPAP control was inadequate at tolerated pressures so he was titrated with BiPAP to 13/9 -----------------------------------------------------------------------------------------------   05/21/23- 78 year old male never smoker followed for OSA/ UPPP, complicated by history of staph dermatitis, Obesity, Peripheral Venous Insufficiency,  BIPAP max IPAP 13, min EPAP 9, PS 4/ [employer] Download- compliance 100%, AHI 0.3/ hr Body weight today-205 lbs -----Wearing CPAP-using a lot of water with humidification Download reviewed.  Machine is old and we discussed replacement but he is doing well with it. Left lower leg is wrapped for stasis ulcer related to venous insufficiency. CXR 05/19/22- IMPRESSION: No acute process.  05/20/24-  78 year old male never smoker followed for OSA/ UPPP, complicated by history of staph dermatitis, Obesity, Peripheral Venous Insufficiency,  BIPAP max IPAP 13, min EPAP 9, PS 4/ [employer] Download- compliance  Body weight today- [facility] in May- dehydration/ confusion   Review of Systems, See HPI    + = positive Constitutional:   No-   weight loss, night sweats, fevers, chills, fatigue, lassitude. HEENT:   No-  headaches, difficulty swallowing, tooth/dental problems, sore throat,       No-  sneezing, itching, ear ache, nasal congestion, post nasal drip,  CV:  No-   chest pain, orthopnea, PND, +swelling in lower extremities, anasarca, dizziness, palpitations Resp: No-   shortness of breath with exertion or at rest.              No-   productive cough,  No non-productive cough,  No-  coughing up  of blood.              No-   change in color of mucus.  No- wheezing.   Skin:  GI:  No-   heartburn, indigestion, abdominal pain, nausea, vomiting, GU: . MS:  No-   joint pain or swelling.   Neuro- nothing unusual  Psych:  No- change in mood or affect. No depression or anxiety.  No memory loss.  Objective:   Physical Exam General- Alert, Oriented, Affect-appropriate, Distress- none acute , + overweight Skin- +ecchymoses arms Lymphadenopathy- none Head- atraumatic            Eyes- Gross vision intact, PERRLA, conjunctivae clear secretions            Ears- Hearing, canals- normal            Nose- Clear, no -Septal dev, mucus+, polyps, erosion, perforation;                  Throat- +s/p UPPP , mucosa -clear, drainage- none, tonsils- atrophic Neck- flexible , trachea midline, no stridor , thyroid nl, carotid no bruit Chest - symmetrical excursion , unlabored           Heart/CV- RRR , no murmur , no gallop  , no rub, nl s1 s2                           - JVD- none , edema+3/ elastic hose, stasis changes- none visible, varices- none           Lung- +fclear wheeze- none, cough- none ,  dullness-none, rub- none           Chest wall-  Abd- Br/ Gen/ Rectal- Not done, not indicated Extrem- + L lower leg wrapped Neuro- grossly intact to observation   Assessment & Plan:

## 2024-05-20 ENCOUNTER — Ambulatory Visit: Payer: Medicare Other | Admitting: Internal Medicine

## 2024-05-20 ENCOUNTER — Encounter: Payer: Self-pay | Admitting: Internal Medicine
